# Patient Record
Sex: Male | Born: 1990 | Race: White | Hispanic: No | Marital: Married | Smoking: Never smoker
Health system: Southern US, Community
[De-identification: ages and names within clinical notes are randomized; demographics above are authoritative.]

## PROBLEM LIST (undated history)

## (undated) DIAGNOSIS — K219 Gastro-esophageal reflux disease without esophagitis: Secondary | ICD-10-CM

## (undated) HISTORY — PX: SHOULDER SURGERY: SHX246

---
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
## 2010-03-26 ENCOUNTER — Emergency Department (HOSPITAL_COMMUNITY): Admission: EM | Admit: 2010-03-26 | Discharge: 2010-03-26 | Payer: Self-pay | Admitting: Emergency Medicine

## 2010-08-15 LAB — GC/CHLAMYDIA PROBE AMP, GENITAL
Chlamydia, DNA Probe: POSITIVE — AB
GC Probe Amp, Genital: NEGATIVE

## 2011-05-02 ENCOUNTER — Emergency Department (HOSPITAL_COMMUNITY)
Admission: EM | Admit: 2011-05-02 | Discharge: 2011-05-02 | Disposition: A | Discharge status: Home / Self Care | Payer: Self-pay | Attending: Emergency Medicine | Admitting: Emergency Medicine

## 2011-05-02 ENCOUNTER — Encounter: Payer: Self-pay | Admitting: *Deleted

## 2011-05-02 DIAGNOSIS — K5900 Constipation, unspecified: Secondary | ICD-10-CM | POA: Insufficient documentation

## 2011-05-02 DIAGNOSIS — L259 Unspecified contact dermatitis, unspecified cause: Secondary | ICD-10-CM | POA: Insufficient documentation

## 2011-05-02 MED ORDER — DIPHENHYDRAMINE HCL 25 MG PO TABS: 25.0000 mg | ORAL_TABLET | Freq: Four times a day (QID) | ORAL | Status: AC

## 2011-05-02 MED ORDER — POLYETHYLENE GLYCOL 3350 17 GM/SCOOP PO POWD: 17.0000 g | Freq: Every day | ORAL | Status: AC

## 2011-05-02 MED ORDER — PREDNISONE 10 MG PO TABS: ORAL_TABLET | ORAL | Status: DC

## 2011-05-02 MED ORDER — PREDNISONE 20 MG PO TABS
60.0000 mg | ORAL_TABLET | Freq: Once | ORAL | Status: AC
  Administered 2011-05-02: 60 mg via ORAL
  Filled 2011-05-02: qty 3

## 2011-05-02 NOTE — ED Notes (Signed)
Pt reports rash to both arms and legs x 1 week

## 2011-05-03 NOTE — ED Provider Notes (Signed)
History     CSN: [id number] Arrival date & time: 05/02/2011  8:00 PM   First MD Initiated Contact with Patient 05/02/11 2001      Chief Complaint  Patient presents with  . Rash    (Consider location/radiation/quality/duration/timing/severity/associated sxs/prior treatment) HPI Comments: In addition to rash,  Patient also describes having chronic problems with constipation. Has bms qod on average,  But hard and difficult to pass.  Has not tried any medicines for relief, but states he has a healthy diet and drinks lots of water daily. See a GI doctor when a teenager in [place] for intermittent diarrhea and constipation.  Was told is sx were from "nerves" as all tests at that time were negative.  Patient is a 20 y.o. male presenting with rash. The history is provided by the patient.  Rash  This is a new problem. The current episode started more than 1 week ago. The problem has been gradually worsening. The problem is associated with an unknown (He describes wearing gloves at work,  a new job he started about 7 weeks ago.  He has been given both cotton gloves,  but also has used a latex glove during his work day.  The rash started on his hands.) factor. There has been no fever. The rash is present on the right hand, left hand, abdomen, right upper leg and left upper leg. The pain is at a severity of 7/10 (Rash burns when he baths or washes his hands.). The pain is mild. The pain has been intermittent since onset. Associated symptoms include itching and pain. He has tried OTC analgesics (Hand lotion) for the symptoms. The treatment provided no relief.    History reviewed. No pertinent past medical history.  Past Surgical History  Procedure Date  . Shoulder surgery     No family history on file.  History  Substance Use Topics  . Smoking status: Never Smoker   . Smokeless tobacco: Not on file  . Alcohol Use: No      Review of Systems  Constitutional: Negative for fever.  HENT: Negative  for congestion, sore throat and neck pain.   Eyes: Negative.   Respiratory: Negative for chest tightness and shortness of breath.   Cardiovascular: Negative for chest pain.  Gastrointestinal: Positive for constipation. Negative for nausea, abdominal pain and diarrhea.  Genitourinary: Negative.   Musculoskeletal: Negative for joint swelling and arthralgias.  Skin: Positive for itching and rash. Negative for wound.  Neurological: Negative for dizziness, weakness, light-headedness, numbness and headaches.  Hematological: Negative.   Psychiatric/Behavioral: Negative.     Allergies  Review of patient's allergies indicates no known allergies.  Home Medications   Current Outpatient Rx  Name Route Sig Dispense Refill  . DIPHENHYDRAMINE HCL 25 MG PO TABS Oral Take 1 tablet (25 mg total) by mouth every 6 (six) hours. Take as needed for itching 20 tablet 0  . POLYETHYLENE GLYCOL 3350 PO POWD Oral Take 17 g by mouth daily. 255 g 0  . PREDNISONE 10 MG PO TABS  Take 6 tabs daily by mouth for 1 day,  Then 5 tabs daily for 2 days,  4 tabs daily for 2 days,  3 tabs daily for 2 days,  2 tabs daily for 2 days,  Then 1 tab daily for 2 days.   36 tablet 0    BP 138/76  Pulse 104  Temp(Src) 98.5 F (36.9 C) (Oral)  Resp 16  Ht 6\' 2"  (1.88 m)  Wt 133 lb (  60.328 kg)  BMI 17.08 kg/m2  SpO2 100%  Physical Exam  Nursing note and vitals reviewed. Constitutional: He is oriented to person, place, and time. He appears well-developed and well-nourished.  HENT:  Head: Normocephalic and atraumatic.  Eyes: Conjunctivae are normal.  Neck: Normal range of motion.  Cardiovascular: Normal rate, regular rhythm, normal heart sounds and intact distal pulses.   Pulmonary/Chest: Effort normal and breath sounds normal. He has no wheezes.  Abdominal: Soft. Bowel sounds are normal. There is no tenderness.  Musculoskeletal: Normal range of motion.  Neurological: He is alert and oriented to person, place, and time.    Skin: Skin is warm and dry. Rash noted. No petechiae noted. Rash is papular.       Papular,  Excoriated rash most prominent on bilateral hands,  Few scattered papules on medial forearms and along lower abdomen.  Several isolated patches of raised excoriated lesions medial thighs.  No drainage, erythema or pustules, no tunneling noted.    Psychiatric: He has a normal mood and affect.    ED Course  Procedures (including critical care time)  Labs Reviewed - No data to display No results found.   1. Contact dermatitis   2. Constipation       MDM  Exam and history most consistent with contact dermatitis.  Started on prednisone taper,  Benadryl prn.  Also prescribed miralax for constipation concern.        Candis Musa, PA 05/03/11 [phone]

## 2011-05-04 NOTE — ED Provider Notes (Signed)
Medical screening examination/treatment/procedure(s) were performed by non-physician practitioner and as supervising physician I was immediately available for consultation/collaboration.  Katherine Syme S. Leea Rambeau, MD 05/04/11 1136 

## 2011-05-09 ENCOUNTER — Emergency Department (HOSPITAL_COMMUNITY)
Admission: EM | Admit: 2011-05-09 | Discharge: 2011-05-09 | Disposition: A | Discharge status: Home / Self Care | Payer: Self-pay | Attending: Emergency Medicine | Admitting: Emergency Medicine

## 2011-05-09 ENCOUNTER — Encounter (HOSPITAL_COMMUNITY): Payer: Self-pay | Admitting: *Deleted

## 2011-05-09 DIAGNOSIS — B86 Scabies: Secondary | ICD-10-CM | POA: Insufficient documentation

## 2011-05-09 MED ORDER — PERMETHRIN 5 % EX CREA: TOPICAL_CREAM | CUTANEOUS | Status: AC

## 2011-05-09 NOTE — ED Notes (Signed)
Tammy Triplett, PA at bedside. 

## 2011-05-09 NOTE — ED Notes (Signed)
Patient with no complaints at this time. Respirations even and unlabored. Skin warm/dry. Discharge instructions reviewed with patient at this time. Patient given opportunity to voice concerns/ask questions. Patient discharged at this time and left Emergency Department with steady gait.   

## 2011-05-09 NOTE — ED Notes (Signed)
Rash for 1 month, seen here for same and given prednisone

## 2011-05-10 NOTE — ED Provider Notes (Signed)
History     CSN: [id number] Arrival date & time: 05/09/2011  6:54 PM   First MD Initiated Contact with Patient 05/09/11 1855      Chief Complaint  Patient presents with  . Rash    (Consider location/radiation/quality/duration/timing/severity/associated sxs/prior treatment) Patient is a 20 y.o. male presenting with rash. The history is provided by the patient.  Rash  This is a new problem. The current episode started more than 1 week ago. The problem has not changed since onset.Associated with: contact with scabies. There has been no fever. The rash is present on the groin, left hand, right hand, right arm, left arm and torso. The patient is experiencing no pain. Associated symptoms include itching. Pertinent negatives include no blisters, no pain and no weeping. He has tried nothing for the symptoms. The treatment provided no relief.    History reviewed. No pertinent past medical history.  Past Surgical History  Procedure Date  . Shoulder surgery     History reviewed. No pertinent family history.  History  Substance Use Topics  . Smoking status: Never Smoker   . Smokeless tobacco: Not on file  . Alcohol Use: Yes      Review of Systems  Constitutional: Negative for fever.  HENT: Negative for sore throat and trouble swallowing.   Eyes: Negative for pain and itching.  Respiratory: Negative for cough.   Gastrointestinal: Negative for abdominal pain.  Musculoskeletal: Negative for myalgias, joint swelling and arthralgias.  Skin: Positive for itching and rash.  Neurological: Negative for headaches.  Hematological: Negative for adenopathy. Does not bruise/bleed easily.  All other systems reviewed and are negative.    Allergies  Review of patient's allergies indicates no known allergies.  Home Medications   Current Outpatient Rx  Name Route Sig Dispense Refill  . DIPHENHYDRAMINE HCL 25 MG PO TABS Oral Take 1 tablet (25 mg total) by mouth every 6 (six) hours. Take as  needed for itching 20 tablet 0  . PREDNISONE 10 MG PO TABS  Take 6 tabs daily by mouth for 1 day,  Then 5 tabs daily for 2 days,  4 tabs daily for 2 days,  3 tabs daily for 2 days,  2 tabs daily for 2 days,  Then 1 tab daily for 2 days.   36 tablet 0  . PERMETHRIN 5 % EX CREA  Apply from neck down, leave on for 10-12 hrs then was off.  May re-apply in 7 days if needed 60 g 0    BP 119/85  Pulse 101  Temp(Src) 98.9 F (37.2 C) (Oral)  Resp 20  Wt 132 lb (59.875 kg)  SpO2 98%  Physical Exam  Nursing note and vitals reviewed. Constitutional: He is oriented to person, place, and time. He appears well-developed and well-nourished. No distress.  HENT:  Head: Normocephalic and atraumatic.  Mouth/Throat: Oropharynx is clear and moist.  Neck: Normal range of motion. Neck supple.  Cardiovascular: Normal rate, regular rhythm and normal heart sounds.   Pulmonary/Chest: Effort normal and breath sounds normal. No respiratory distress. He exhibits no tenderness.  Abdominal: Soft.  Musculoskeletal: Normal range of motion. He exhibits no edema and no tenderness.  Lymphadenopathy:    He has no cervical adenopathy.  Neurological: He is alert and oriented to person, place, and time. No cranial nerve deficit. He exhibits normal muscle tone. Coordination normal.  Skin: Skin is warm and dry. Rash noted. Rash is papular.       Scattered erythematous papules to the dorsal  hands and web spaces.  Palms are spared.  Rash also present on the lower torso and forearms.      ED Course  Procedures (including critical care time)   1. Scabies       MDM    Papular rash to the dorsal hands, forearms and groin. Was treated recently with prednisone w/o improvement.   Papules also present within the web spaces of the fingers.  Likely scabies. Patient also had recent exposure to scabies.  I will treat with permethrin.          Christe Tellez L. Demisha Nokes, PA 05/10/11 0111

## 2011-05-14 NOTE — ED Provider Notes (Signed)
Medical screening examination/treatment/procedure(s) were performed by non-physician practitioner and as supervising physician I was immediately available for consultation/collaboration.   Nolie Bignell W Mylie Mccurley, MD 05/14/11 0041 

## 2011-11-14 ENCOUNTER — Encounter (HOSPITAL_COMMUNITY): Payer: Self-pay | Admitting: Emergency Medicine

## 2011-11-14 ENCOUNTER — Emergency Department (HOSPITAL_COMMUNITY)
Admission: EM | Admit: 2011-11-14 | Discharge: 2011-11-14 | Disposition: A | Discharge status: Home / Self Care | Payer: Self-pay | Attending: Emergency Medicine | Admitting: Emergency Medicine

## 2011-11-14 DIAGNOSIS — T672XXA Heat cramp, initial encounter: Secondary | ICD-10-CM | POA: Insufficient documentation

## 2011-11-14 DIAGNOSIS — R112 Nausea with vomiting, unspecified: Secondary | ICD-10-CM | POA: Insufficient documentation

## 2011-11-14 DIAGNOSIS — X30XXXA Exposure to excessive natural heat, initial encounter: Secondary | ICD-10-CM | POA: Insufficient documentation

## 2011-11-14 DIAGNOSIS — T675XXA Heat exhaustion, unspecified, initial encounter: Secondary | ICD-10-CM

## 2011-11-14 HISTORY — DX: Gastro-esophageal reflux disease without esophagitis: K21.9

## 2011-11-14 LAB — URINALYSIS, ROUTINE W REFLEX MICROSCOPIC
Bilirubin Urine: NEGATIVE
Glucose, UA: NEGATIVE mg/dL
Nitrite: NEGATIVE
Protein, ur: NEGATIVE mg/dL
Urobilinogen, UA: 0.2 mg/dL (ref 0.0–1.0)

## 2011-11-14 LAB — CK: Total CK: 190 U/L (ref 7–232)

## 2011-11-14 LAB — BASIC METABOLIC PANEL
CO2: 30 mEq/L (ref 19–32)
Calcium: 9.8 mg/dL (ref 8.4–10.5)
Sodium: 137 mEq/L (ref 135–145)

## 2011-11-14 MED ORDER — SODIUM CHLORIDE 0.9 % IV BOLUS (SEPSIS)
1000.0000 mL | INTRAVENOUS | Status: AC
  Administered 2011-11-14: 1000 mL via INTRAVENOUS

## 2011-11-14 MED ORDER — SODIUM CHLORIDE 0.9 % IV SOLN
Freq: Once | INTRAVENOUS | Status: AC
  Administered 2011-11-14: 05:00:00 via INTRAVENOUS

## 2011-11-14 MED ORDER — KETOROLAC TROMETHAMINE 30 MG/ML IJ SOLN
30.0000 mg | Freq: Once | INTRAMUSCULAR | Status: AC
  Administered 2011-11-14: 30 mg via INTRAVENOUS
  Filled 2011-11-14: qty 1

## 2011-11-14 MED ORDER — ONDANSETRON HCL 4 MG/2ML IJ SOLN
4.0000 mg | Freq: Once | INTRAMUSCULAR | Status: AC
  Administered 2011-11-14: 4 mg via INTRAVENOUS
  Filled 2011-11-14: qty 2

## 2011-11-14 MED ORDER — MORPHINE SULFATE 4 MG/ML IJ SOLN
2.0000 mg | Freq: Once | INTRAMUSCULAR | Status: AC
  Administered 2011-11-14: 2 mg via INTRAVENOUS
  Filled 2011-11-14: qty 1

## 2011-11-14 MED ORDER — PROMETHAZINE HCL 25 MG PO TABS: 12.5000 mg | ORAL_TABLET | Freq: Four times a day (QID) | ORAL | Status: DC | PRN

## 2011-11-14 NOTE — ED Notes (Signed)
Remains resting comfortably. Denies needs. No distress. Call bell within reach. Friend at bedside. Will continue to monitor.

## 2011-11-14 NOTE — ED Notes (Signed)
Lab at bedside to draw blood.

## 2011-11-14 NOTE — ED Provider Notes (Signed)
History     CSN: [id number]  Arrival date & time 11/14/11  0105   First MD Initiated Contact with Patient 11/14/11 0123      Chief Complaint  Patient presents with  . Weakness  . Abdominal Pain  . Emesis    (Consider location/radiation/quality/duration/timing/severity/associated sxs/prior treatment) HPI Christopher Frey is a 21 y.o. male who presents to the Emergency Department complaining of muscle cramping, nausea and vomiting after working outdoors all day yesterday, He states he began feeling poorly midday yesterday despite drinking lots of fluids. Now with abdominal cramping, back cramping and leg cramping. Has taken no medicines.   PCP De. McGough    Past Medical History  Diagnosis Date  . Acid reflux     Past Surgical History  Procedure Date  . Shoulder surgery     History reviewed. No pertinent family history.  History  Substance Use Topics  . Smoking status: Never Smoker   . Smokeless tobacco: Not on file  . Alcohol Use: No      Review of Systems  Constitutional: Negative for fever.       10 Systems reviewed and are negative for acute change except as noted in the HPI.  HENT: Negative for congestion.   Eyes: Negative for discharge and redness.  Respiratory: Negative for cough and shortness of breath.   Cardiovascular: Negative for chest pain.  Gastrointestinal: Positive for nausea, vomiting and abdominal pain.  Musculoskeletal: Negative for back pain.       Leg and back cramping  Skin: Negative for rash.  Neurological: Negative for syncope, numbness and headaches.  Psychiatric/Behavioral:       No behavior change.    Allergies  Review of patient's allergies indicates no known allergies.  Home Medications   Current Outpatient Rx  Name Route Sig Dispense Refill  . PREDNISONE 10 MG PO TABS  Take 6 tabs daily by mouth for 1 day,  Then 5 tabs daily for 2 days,  4 tabs daily for 2 days,  3 tabs daily for 2 days,  2 tabs daily for 2 days,  Then 1 tab  daily for 2 days.   36 tablet 0    BP 125/73  Pulse 101  Temp 97.9 F (36.6 C) (Oral)  Resp 18  Ht 6\' 2"  (1.88 m)  Wt 138 lb (62.596 kg)  BMI 17.72 kg/m2  SpO2 98%  Physical Exam  Nursing note and vitals reviewed. Constitutional: He appears well-developed and well-nourished. No distress.       Awake, alert, nontoxic appearance.  HENT:  Head: Atraumatic.  Eyes: EOM are normal. Pupils are equal, round, and reactive to light. Right eye exhibits no discharge. Left eye exhibits no discharge.  Neck: Neck supple.  Cardiovascular: Regular rhythm and normal heart sounds.   Pulmonary/Chest: Effort normal and breath sounds normal. He exhibits no tenderness.  Abdominal: Soft. There is no tenderness. There is no rebound and no guarding.  Musculoskeletal: He exhibits no tenderness.       Baseline ROM, no obvious new focal weakness.  Neurological:       Mental status and motor strength appears baseline for patient and situation.  Skin: No rash noted.  Psychiatric: He has a normal mood and affect.    ED Course  Procedures (including critical care time) Results for orders placed during the hospital encounter of 11/14/11  BASIC METABOLIC PANEL      Component Value Range   Sodium 137  135 - 145 mEq/L  Potassium 4.7  3.5 - 5.1 mEq/L   Chloride 97  96 - 112 mEq/L   CO2 30  19 - 32 mEq/L   Glucose, Bld 115 (*) 70 - 99 mg/dL   BUN 21  6 - 23 mg/dL   Creatinine, Ser 1.61  0.50 - 1.35 mg/dL   Calcium 9.8  8.4 - 09.6 mg/dL   GFR calc non Af Amer >90  >90 mL/min   GFR calc Af Amer >90  >90 mL/min  CK      Component Value Range   Total CK 190  7 - 232 U/L       MDM  Patient with heat related illness characterized by muscle cramping, nausea and vomiting. Given 5 L IVF,  antiinflammatory, analgesic and antiemetic. Patient with relief from cramping and nausea. Able to take PO fluids. Ambulated in the department.  Pt feels improved after observation and/or treatment in ED.Pt stable in ED  with no significant deterioration in condition.The patient appears reasonably screened and/or stabilized for discharge and I doubt any other medical condition or other [facility] requiring further screening, evaluation, or treatment in the ED at this time prior to discharge.  MDM Reviewed: nursing note and vitals Interpretation: labs           Nicoletta Dress. Colon Branch, MD 11/14/11 [phone]

## 2011-11-14 NOTE — ED Notes (Signed)
Patient states he works outside and started feeling weak and nauseated since lunchtime yesterday. Weakness is worse and patient vomited before arrival to ED. Also complaining of abdominal pain and back cramping.

## 2011-11-14 NOTE — ED Notes (Signed)
Remains resting on back. Pain 4\10 at this time. States he can not pass urine for specimen. Friend at bedside. Call bell within reach. Denies needs.

## 2011-11-14 NOTE — Discharge Instructions (Signed)
Drink lots of fluids. Use the nausea medicine as needed.   Heat-Related Illness Heat-related illnesses occur when the body is unable to properly cool itself. The body normally cools itself by sweating. However, under some conditions sweating is not enough. In these cases, a person's body temperature rises rapidly. Very high body temperatures may damage the brain or other vital organs. Some examples of heat-related illnesses include:  Heat stroke. This occurs when the body is unable to regulate its temperature. The body's temperature rises rapidly, the sweating mechanism fails, and the body is unable to cool down. Body temperature may rise to 106 F (41 C) or higher within 10 to 15 minutes. Heat stroke can cause death or permanent disability if emergency treatment is not provided.   Heat exhaustion. This is a milder form of heat-related illness that can develop after several days of exposure to high temperatures and not enough fluids. It is the body's response to an excessive loss of the water and salt contained in sweat.   Heat cramps. These usually affect people who sweat a lot during heavy activity. This sweating drains the body's salt and moisture. The low salt level in the muscles causes painful cramps. Heat cramps may also be a symptom of heat exhaustion. Heat cramps usually occur in the abdomen, arms, or legs. Get medical attention for cramps if you have heart problems or are on a low-sodium diet.  Those that are at greatest risk for heat-related illnesses include:   The elderly.   Infant and the very young.   People with mental illness and chronic diseases.   People who are overweight (obese).   Young and healthy people can even succumb to heat if they participate in strenuous physical activities during hot weather.  CAUSES  Several factors affect the body's ability to cool itself during extremely hot weather. When the humidity is high, sweat will not evaporate as quickly. This  prevents the body from releasing heat quickly. Other factors that can affect the body's ability to cool down include:   Age.   Obesity.   Fever.   Dehydration.   Heart disease.   Mental illness.   Poor circulation.   Sunburn.   Prescription drug use.   Alcohol use.  SYMPTOMS  Heat stroke: Warning signs of heat stroke vary, but may include:  An extremely high body temperature (above 103F orally).   A fast, strong pulse.   Dizziness.   Confusion.   Red, hot, and dry skin.   No sweating.   Throbbing headache.   Feeling sick to your stomach (nauseous).   Unconsciousness.  Heat exhaustion: Warning signs of heat exhaustion include:  Heavy sweating.   Tiredness.   Headache.   Paleness.   Weakness.   Feeling sick to your stomach (nauseous) or vomiting.   Muscle cramps.  Heat cramps  Muscle pains or spasms.  TREATMENT  Heat stroke  Get into a cool environment. An indoor place that is air-conditioned may be best.   Take a cool shower or bath. Have someone around to make sure you are okay.   Take your temperature. Make sure it is going down.  Heat exhaustion  Drink plenty of fluids. Do not drink liquids that contain caffeine, alcohol, or large amounts of sugar. These cause you to lose more body fluid. Also, avoid very cold drinks. They can cause stomach cramps.   Get into a cool environment. An indoor place that is air-conditioned may be best.   Take a cool  shower or bath. Have someone around to make sure you are okay.   Put on lightweight clothing.  Heat cramps  Stop whatever activity you were doing. Do not attempt to do that activity for at least 3 hours after the cramps have gone away.   Get into a cool environment. An indoor place that is air-conditioned may be best.  HOME CARE INSTRUCTIONS  To protect your health when temperatures are extremely high, follow these tips:  During heavy exercise in a hot environment, drink two to four glasses  (16-32 ounces) of cool fluids each hour. Do not wait until you are thirsty to drink. Warning: If your caregiver limits the amount of fluid you drink or has you on water pills, ask how much you should drink while the weather is hot.   Do not drink liquids that contain caffeine, alcohol, or large amounts of sugar. These cause you to lose more body fluid.   Avoid very cold drinks. They can cause stomach cramps.   Wear appropriate clothing. Choose lightweight, light-colored, loose-fitting clothing.   If you must be outdoors, try to limit your outdoor activity to morning and evening hours. Try to rest often in shady areas.   If you are not used to working or exercising in a hot environment, start slowly and pick up the pace gradually.   Stay cool in an air-conditioned place if possible. If your home does not have air conditioning, go to the shopping mall or [employer].   Taking a cool shower or bath may help you cool off.  SEEK MEDICAL CARE IF:   You see any of the symptoms listed above. You may be dealing with a life-threatening emergency.   Symptoms worsen or last longer than 1 hour.   Heat cramps do not get better in 1 hour.  MAKE SURE YOU:   Understand these instructions.   Will watch your condition.   Will get help right away if you are not doing well or get worse.  Document Released: 02/27/2008 Document Revised: 05/09/2011 Document Reviewed: 02/27/2008 [facility] Patient Information 2012 [place], [place].

## 2011-11-14 NOTE — ED Notes (Signed)
Remains resting in bed on back. Pain 4\10. Denies nausea. Resting comfortably. Denies needs. Will continue to monitor. Friend at bedside. Bed in low position and locked with side rails up. Call bell within reach. 

## 2011-11-14 NOTE — ED Notes (Signed)
Pain 2\10 at this time. Denies nausea. Resting comfortably in bed on back. Call bell at bedside. Will continue to monitor.

## 2011-11-14 NOTE — ED Notes (Signed)
Remains resting with eyes closed and lights off. Normal saline infusing well. No signs of infiltration. Call bell within reach. Will continue to monitor.

## 2011-11-14 NOTE — ED Notes (Signed)
Remains resting in bed on back. Pain 4\10. Denies nausea. Resting comfortably. Denies needs. Will continue to monitor. Friend at bedside. Bed in low position and locked with side rails up. Call bell within reach.

## 2011-11-14 NOTE — ED Notes (Signed)
Into room to assess patient. States he started having all over body pain, nausea, and vomiting as well as lower back pain after working outside all day. States it is a constant cramp, ache. Nothing makes it better or worse. Pain 8\10. Active bowel sounds in all fields. No tenderness. Good skin turgor. Dry mucous membranes.

## 2011-11-20 ENCOUNTER — Encounter (HOSPITAL_COMMUNITY): Payer: Self-pay | Admitting: *Deleted

## 2011-11-20 ENCOUNTER — Emergency Department (HOSPITAL_COMMUNITY)
Admission: EM | Admit: 2011-11-20 | Discharge: 2011-11-20 | Disposition: A | Discharge status: Home / Self Care | Payer: Self-pay | Attending: Emergency Medicine | Admitting: Emergency Medicine

## 2011-11-20 DIAGNOSIS — K219 Gastro-esophageal reflux disease without esophagitis: Secondary | ICD-10-CM | POA: Insufficient documentation

## 2011-11-20 DIAGNOSIS — T675XXA Heat exhaustion, unspecified, initial encounter: Secondary | ICD-10-CM | POA: Insufficient documentation

## 2011-11-20 DIAGNOSIS — R42 Dizziness and giddiness: Secondary | ICD-10-CM | POA: Insufficient documentation

## 2011-11-20 DIAGNOSIS — X30XXXA Exposure to excessive natural heat, initial encounter: Secondary | ICD-10-CM | POA: Insufficient documentation

## 2011-11-20 LAB — URINALYSIS, ROUTINE W REFLEX MICROSCOPIC
Bilirubin Urine: NEGATIVE
Glucose, UA: NEGATIVE mg/dL
Hgb urine dipstick: NEGATIVE
Ketones, ur: NEGATIVE mg/dL
Leukocytes, UA: NEGATIVE
Nitrite: NEGATIVE
Protein, ur: NEGATIVE mg/dL
Specific Gravity, Urine: 1.01 (ref 1.005–1.030)
Urobilinogen, UA: 0.2 mg/dL (ref 0.0–1.0)
pH: 7.5 (ref 5.0–8.0)

## 2011-11-20 LAB — BASIC METABOLIC PANEL
CO2: 31 mEq/L (ref 19–32)
Chloride: 99 mEq/L (ref 96–112)
GFR calc non Af Amer: >90 mL/min (ref >90)

## 2011-11-20 LAB — POCT I-STAT TROPONIN I: Troponin i, poc: 0 ng/mL (ref 0.00–0.08)

## 2011-11-20 LAB — CK: Total CK: 124 U/L (ref 7–232)

## 2011-11-20 MED ORDER — SODIUM CHLORIDE 0.9 % IV BOLUS (SEPSIS)
2000.0000 mL | Freq: Once | INTRAVENOUS | Status: AC
  Administered 2011-11-20: 2000 mL via INTRAVENOUS

## 2011-11-20 MED ORDER — DIPHENHYDRAMINE HCL 50 MG/ML IJ SOLN
25.0000 mg | Freq: Once | INTRAMUSCULAR | Status: AC
  Administered 2011-11-20: 25 mg via INTRAVENOUS
  Filled 2011-11-20: qty 1

## 2011-11-20 MED ORDER — SODIUM CHLORIDE 0.9 % IV SOLN
INTRAVENOUS | Status: DC
  Administered 2011-11-20: 17:00:00 via INTRAVENOUS

## 2011-11-20 MED ORDER — METOCLOPRAMIDE HCL 5 MG/ML IJ SOLN
10.0000 mg | Freq: Once | INTRAMUSCULAR | Status: AC
  Administered 2011-11-20: 10 mg via INTRAVENOUS
  Filled 2011-11-20: qty 2

## 2011-11-20 NOTE — Discharge Instructions (Signed)
Drink plenty of fluids, especially sports drinks when you are working outside in the heat, just water will not keep you hydrated.  Try to stay cool today and tomorrow. You will find you will be extra sensitive to heat for awhile.   Heat Illness Heat exhaustion happens when the body loses too much water through sweating. This can lead to heat stroke. Heat stroke is a medical emergency. People who work in [employer], athletes, and older people are at greater risk for suffering from heat illness. SYMPTOMS   Exhaustion.   Dizziness.   Fainting.   Muscle cramps.   Nausea.   Vomiting.   Chills and goose bumps.  PREVENTION   You must drink increased amounts of sports drinks during hot weather to prevent heat illness.   This is especially true if you work or do vigorous exercise in the heat. Up to a gallon of sweat can be lost every hour extremely hot or humid conditions.   You will stay cooler by reducing your effort and by dousing yourself with water often.   Certain drugs increase the risk of heat illness because they reduce sweating. These include antidepressants and antihistamines.   Be more cautious during hot weather.   Drink several glasses of water before, during, and after vigorous activity.  SEEK MEDICAL CARE IF:  You have any heat-related problems. Document Released: 06/27/2004 Document Revised: 05/09/2011 Document Reviewed: 02/18/2008 [facility] Patient Information 2012 [place], [place].

## 2011-11-20 NOTE — ED Provider Notes (Signed)
History   This chart was scribed for Ward Givens, MD by Charolett Bumpers . The patient was seen in room APA01/APA01.    CSN: [id number]  Arrival date & time 11/20/11  1458   First MD Initiated Contact with Patient 11/20/11 1509      Chief Complaint  Patient presents with  . Loss of Consciousness    (Consider location/radiation/quality/duration/timing/severity/associated sxs/prior treatment) HPI Christopher Frey is a 21 y.o. male who presents to the Emergency Department complaining of single-episode, moderate, acute onset LOC that occurred PTA today. 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 Patient states that he had been working all day today outside starting at 6:00 am, and breaking for lunch at 11:00 am. Patient states that he had diaphoresis at lunch and ate outside in [place] area. Patient states that he starting feeling dizzy after lunch when he went back to work and started vomiting despite drinking lots of water. Patient states that he then had a syncope episode. Per family, the patient's "eyes rolled back in his head". Pt was sitting in a truck when he started vomiting and then was laid down after he passed out.  Patient denies falling or any injuries. Patient reports an associated headache and generalized weakness. Patient states that he is otherwise healthy and reports no prior medical problems. Patient denies taking any medications. Patient denies smoking or alcohol use. 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   No PCP  Past Medical History  Diagnosis Date  . Acid reflux     Past Surgical History  Procedure Date  . Shoulder surgery     History reviewed. No pertinent family history.  History  Substance Use Topics  . Smoking status: Never Smoker   . Smokeless tobacco: Not on file  . Alcohol Use: No    Employed by father   Review of Systems  Constitutional: Positive for diaphoresis.  Gastrointestinal: Positive for nausea and vomiting.  Neurological: Positive for syncope, weakness and headaches.  All other systems reviewed and are negative.    Allergies  Review of patient's allergies indicates no known allergies.  Home Medications   No current outpatient prescriptions on file.  BP 132/88  Pulse 81  Temp 98 F (36.7 C) (Oral)  Resp 20  Ht 6\' 2"  (1.88 m)  Wt 135 lb (61.236 kg)  BMI 17.33 kg/m2  SpO2 100%  Vital signs normal    Physical Exam  Nursing note and vitals reviewed. Constitutional: He is oriented to person, place, and time. He appears well-developed and well-nourished.  Non-toxic appearance. He does not appear ill. No distress.       Patient appeared weak, kept eyes closed and let his mother talk for him.    HENT:  Head: Normocephalic and atraumatic.  Right Ear: External ear normal.  Left Ear: External ear normal.  Nose: Nose normal. No mucosal edema or rhinorrhea.  Mouth/Throat: Mucous membranes are dry. No dental abscesses or uvula swelling.       Dry tongue  Eyes: Conjunctivae and EOM are normal. Pupils are equal, round, and reactive to light.  Neck: Normal range of motion and full passive range of motion without pain. Neck supple. No tracheal deviation present.  Cardiovascular: Normal rate, regular rhythm, normal heart sounds and intact distal pulses.  Exam  reveals no gallop and no friction rub.   No murmur heard. Pulmonary/Chest: Effort normal and breath sounds normal. No respiratory distress. He has no wheezes. He has no rhonchi. He has no rales. He exhibits no tenderness and no crepitus.  Abdominal: Soft. Normal appearance and bowel sounds are normal. He exhibits no distension. There is no tenderness. There is no rebound and no guarding.  Musculoskeletal: Normal range of motion. He exhibits no edema and no tenderness.       Moves all extremities  well.   Neurological: He is alert and oriented to person, place, and time. He has normal strength. No cranial nerve deficit or sensory deficit.  Skin: Skin is warm, dry and intact. No rash noted. No erythema. There is pallor.  Psychiatric: His speech is normal and behavior is normal. His mood appears not anxious.       Flat, lying with eyes closed, speaks softly    ED Course  Procedures (including critical care time)   Medications  0.9 %  sodium chloride infusion (  Intravenous New Bag/Given 11/20/11 1633)  sodium chloride 0.9 % bolus 2,000 mL (2000 mL Intravenous Given 11/20/11 1548)  metoCLOPramide (REGLAN) injection 10 mg (10 mg Intravenous Given 11/20/11 1548)  diphenhydrAMINE (BENADRYL) injection 25 mg (25 mg Intravenous Given 11/20/11 1548)      DIAGNOSTIC STUDIES: Oxygen Saturation is 100% on room air, normal by my interpretation.    COORDINATION OF CARE:  1537: Discussed planned course of treatment with the patient and family, who is agreeable at this time.  1545: Medication Orders: Diphenhydramine (Benadryl) injection 25 mg-once; Metoclopramide (Reglan) injection 10 mg-once; 0.9% sodium chloride infusion-continuous; Sodium chloride 0.9% bolus 2,000 mL-once.  1723: Recheck: Informed patient of lab results. Patient states that he is feeling improved and appears improved. Has been drinking fluids without problems.   Pt ambulated and feels fine.    Results for orders placed during the hospital encounter of 11/20/11  BASIC METABOLIC PANEL      Component Value Range   Sodium 138  135 - 145 mEq/L   Potassium 4.9  3.5 - 5.1 mEq/L   Chloride 99  96 - 112 mEq/L   CO2 31  19 - 32 mEq/L   Glucose, Bld 107 (*) 70 - 99 mg/dL   BUN 13  6 - 23 mg/dL   Creatinine, Ser 1.61  0.50 - 1.35 mg/dL   Calcium 09.6  8.4 - 04.5 mg/dL   GFR calc non Af Amer >90  >90 mL/min   GFR calc Af Amer >90  >90 mL/min  CK      Component Value Range   Total CK 124  7 - 232 U/L  URINALYSIS, ROUTINE W  REFLEX MICROSCOPIC      Component Value Range   Color, Urine STRAW (*) YELLOW   APPearance CLEAR  CLEAR   Specific Gravity, Urine 1.010  1.005 - 1.030   pH 7.5  5.0 - 8.0   Glucose, UA NEGATIVE  NEGATIVE mg/dL   Hgb urine dipstick NEGATIVE  NEGATIVE   Bilirubin Urine NEGATIVE  NEGATIVE   Ketones, ur NEGATIVE  NEGATIVE mg/dL   Protein, ur NEGATIVE  NEGATIVE mg/dL   Urobilinogen, UA 0.2  0.0 - 1.0 mg/dL   Nitrite NEGATIVE  NEGATIVE   Leukocytes, UA NEGATIVE  NEGATIVE  POCT I-STAT TROPONIN I      Component Value Range   Troponin i, poc 0.00  0.00 - 0.08 ng/mL   Comment 3  Laboratory interpretation all normal    Date: 11/20/2011  Rate: 78  Rhythm: normal sinus rhythm  QRS Axis: right  Intervals: normal  ST/T Wave abnormalities: early repolarization  Conduction Disutrbances:none  Narrative Interpretation: LAE  Old EKG Reviewed: none available   1. Heat exhaustion    Plan discharge  Devoria Albe, MD, FACEP   MDM   I personally performed the services described in this documentation, which was scribed in my presence. The recorded information has been reviewed and considered. Devoria Albe, MD, Armando Gang        Ward Givens, MD 11/20/11 1754

## 2011-11-20 NOTE — ED Notes (Signed)
Vomited, then went to sit in a truck. When seen by his father "eyes rolled back in his head"  Had been working outside .  Pt shakey at triage.  Seen here last week and had to have IV fld.  Removed a tick last week.  Alert.

## 2012-05-20 ENCOUNTER — Emergency Department (HOSPITAL_COMMUNITY)
Admission: EM | Admit: 2012-05-20 | Discharge: 2012-05-20 | Disposition: A | Discharge status: Home / Self Care | Payer: Worker's Compensation | Attending: Emergency Medicine | Admitting: Emergency Medicine

## 2012-05-20 ENCOUNTER — Other Ambulatory Visit (HOSPITAL_COMMUNITY): Payer: Self-pay | Admitting: Preventative Medicine

## 2012-05-20 ENCOUNTER — Encounter (HOSPITAL_COMMUNITY): Payer: Self-pay | Admitting: Emergency Medicine

## 2012-05-20 ENCOUNTER — Emergency Department (HOSPITAL_COMMUNITY): Payer: Worker's Compensation

## 2012-05-20 DIAGNOSIS — M542 Cervicalgia: Secondary | ICD-10-CM

## 2012-05-20 DIAGNOSIS — S139XXA Sprain of joints and ligaments of unspecified parts of neck, initial encounter: Secondary | ICD-10-CM | POA: Insufficient documentation

## 2012-05-20 DIAGNOSIS — Y92410 Unspecified street and highway as the place of occurrence of the external cause: Secondary | ICD-10-CM | POA: Insufficient documentation

## 2012-05-20 DIAGNOSIS — Z8719 Personal history of other diseases of the digestive system: Secondary | ICD-10-CM | POA: Insufficient documentation

## 2012-05-20 DIAGNOSIS — Y9389 Activity, other specified: Secondary | ICD-10-CM | POA: Insufficient documentation

## 2012-05-20 DIAGNOSIS — S161XXA Strain of muscle, fascia and tendon at neck level, initial encounter: Secondary | ICD-10-CM

## 2012-05-20 MED ORDER — HYDROCODONE-ACETAMINOPHEN 5-325 MG PO TABS: ORAL_TABLET | ORAL | Status: DC

## 2012-05-20 MED ORDER — IBUPROFEN 800 MG PO TABS
800.0000 mg | ORAL_TABLET | Freq: Once | ORAL | Status: AC
  Administered 2012-05-20: 800 mg via ORAL
  Filled 2012-05-20: qty 1

## 2012-05-20 MED ORDER — CYCLOBENZAPRINE HCL 10 MG PO TABS: 10.0000 mg | ORAL_TABLET | Freq: Three times a day (TID) | ORAL | Status: DC | PRN

## 2012-05-20 MED ORDER — IBUPROFEN 800 MG PO TABS: 800.0000 mg | ORAL_TABLET | Freq: Three times a day (TID) | ORAL | Status: DC
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
## 2012-05-20 NOTE — ED Provider Notes (Signed)
Medical screening examination/treatment/procedure(s) were performed by non-physician practitioner and as supervising physician I was immediately available for consultation/collaboration.  Donnetta Hutching, MD 05/20/12 [phone]

## 2012-05-20 NOTE — ED Notes (Signed)
Pt c/o neck pain and rt shoulder pain since yesterday. Pt describes pain as needle pricks.
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
## 2012-05-21 ENCOUNTER — Ambulatory Visit (HOSPITAL_COMMUNITY)
Admission: RE | Admit: 2012-05-21 | Discharge: 2012-05-21 | Disposition: A | Discharge status: Home / Self Care | Payer: Worker's Compensation | Source: Ambulatory Visit | Attending: Preventative Medicine | Admitting: Preventative Medicine

## 2012-05-21 DIAGNOSIS — M5020 Other cervical disc displacement, unspecified cervical region: Secondary | ICD-10-CM | POA: Insufficient documentation

## 2012-05-21 DIAGNOSIS — M542 Cervicalgia: Secondary | ICD-10-CM

## 2012-06-05 ENCOUNTER — Ambulatory Visit (HOSPITAL_COMMUNITY)
Admission: RE | Admit: 2012-06-05 | Discharge: 2012-06-05 | Disposition: A | Discharge status: Home / Self Care | Payer: Worker's Compensation | Source: Ambulatory Visit | Attending: Preventative Medicine | Admitting: Preventative Medicine

## 2012-06-05 DIAGNOSIS — M2560 Stiffness of unspecified joint, not elsewhere classified: Secondary | ICD-10-CM | POA: Insufficient documentation

## 2012-06-05 DIAGNOSIS — M542 Cervicalgia: Secondary | ICD-10-CM | POA: Insufficient documentation

## 2012-06-05 DIAGNOSIS — M6281 Muscle weakness (generalized): Secondary | ICD-10-CM | POA: Insufficient documentation

## 2012-06-05 DIAGNOSIS — IMO0001 Reserved for inherently not codable concepts without codable children: Secondary | ICD-10-CM | POA: Insufficient documentation

## 2012-06-05 DIAGNOSIS — R262 Difficulty in walking, not elsewhere classified: Secondary | ICD-10-CM | POA: Insufficient documentation
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
## 2012-06-08 ENCOUNTER — Ambulatory Visit (HOSPITAL_COMMUNITY)
Admission: RE | Admit: 2012-06-08 | Discharge: 2012-06-08 | Disposition: A | Discharge status: Home / Self Care | Payer: Worker's Compensation | Source: Ambulatory Visit | Attending: Preventative Medicine | Admitting: Preventative Medicine

## 2012-06-08 NOTE — Progress Notes (Signed)
Physical Therapy Treatment Patient Details  Name: Christopher Frey MRN: [id number] Date of Birth: 15-Dec-1990  Today's Date: 06/08/2012 Time: 1304-1400 PT Time Calculation (min): 56 min  Visit#: 2  of 6   Re-eval: 06/19/12 Charges: Therex x 30' Traction w/MHP x 15'  Authorization: med assurance    Subjective: Symptoms/Limitations Symptoms: Pt states that traction decrease pain last session. Pain Assessment Currently in Pain?: Yes Pain Score:   6 Pain Location: Neck Pain Orientation: Mid;Posterior   Exercise/Treatments Seated Exercises Cervical Rotation: 10 reps;Both Lateral Flexion: 10 reps;Both W Back: 10 reps Shoulder Shrugs: 10 reps Shoulder Shrugs Limitations: up back and relax Supine Ab Set: 10 reps;5 seconds Bent Knee Raise: 10 reps Other Supine Lumbar Exercises: B UE flexion with ab set x 10  Modalities Modalities: Moist Heat;Traction Moist Heat Therapy Number Minutes Moist Heat: 15 Minutes Moist Heat Location:  (cervical and lumbar during traction) Traction Type of Traction: Cervical Max (lbs): 12# Hold Time: Static Time: 28'  Physical Therapy Assessment and Plan PT Assessment and Plan Clinical Impression Statement: Pt completes therex with good form after initial cueing and demonstration. Began mechanical cervical traction to decrease pain. Pt reports cervical pain decrease to 3/10 at end of session. PT Duration:  (2 weeks) PT Plan: Continue to progress per PT POC. Begin tband exercises and thumbtacks next session.      Problem List Patient Active Problem List  Diagnosis  . Difficulty in walking  . Stiffness of joints, not elsewhere classified, multiple sites    PT - End of Session Activity Tolerance: Patient tolerated treatment well General Behavior During Session: [facility] for tasks performed  Seth Bake, PTA 06/08/2012, 2:24 PM

## 2012-06-10 ENCOUNTER — Ambulatory Visit (HOSPITAL_COMMUNITY)
Admission: RE | Admit: 2012-06-10 | Discharge: 2012-06-10 | Disposition: A | Discharge status: Home / Self Care | Payer: Worker's Compensation | Source: Ambulatory Visit | Attending: Preventative Medicine | Admitting: Preventative Medicine

## 2012-06-10 DIAGNOSIS — M2560 Stiffness of unspecified joint, not elsewhere classified: Secondary | ICD-10-CM

## 2012-06-10 DIAGNOSIS — R262 Difficulty in walking, not elsewhere classified: Secondary | ICD-10-CM

## 2012-06-10 NOTE — Progress Notes (Signed)
Physical Therapy Treatment Patient Details  Name: JORAH HUA MRN: [id number] Date of Birth: June 09, 1990  Today's Date: 06/10/2012 Time: 1303-1358 PT Time Calculation (min): 55 min  Visit#: 3  of 6   Re-eval: 06/19/12    Authorization: med assurance  Authorization Time Period:    Authorization Visit#:   of     Subjective: Symptoms/Limitations Symptoms: Pt states that the mechanical traction caused him to have increased pain last time requests not to do it again Pain Assessment Pain Score:   3 Pain Location: Neck Pain Orientation: Posterior Pain Type: Acute pain   Exercise/Treatments Stretches Single Knee to Chest Stretch: 5 reps Lower Trunk Rotation: 5 reps Aerobic Elliptical: 5' L 3 UBE (Upper Arm Bike): backward 3'  Standing Scapular Retraction: Strengthening;10 reps;Theraband Theraband Level (Scapular Retraction): Level 4 (Blue) Row: Strengthening;10 reps;Theraband Theraband Level (Row): Level 4 (Blue) Shoulder Extension: Strengthening;10 reps;Theraband Other Standing Lumbar Exercises: thumb tacks x 1 min Seated Other Seated Lumbar Exercises: w back x 10 Supine Clam: 10 reps Bent Knee Raise: 10 reps Bridge: 10 reps Isometric Hip Flexion: 10 reps    Modalities Modalities: Moist Heat Manual Therapy Manual Therapy: Other (comment) Other Manual Therapy: gentle manual traction with STM x 10 Moist Heat Therapy Number Minutes Moist Heat: 39 Minutes Moist Heat Location:  (cervical and lumbar while doing lumbar ex)  Physical Therapy Assessment and Plan PT Assessment and Plan Clinical Impression Statement: Pt has good form w/exercises.  No mechanical traction as this irritates pt.  PT Plan: begin prone ex as well as assessing cervical ROM to ensure ROM is approaching Full range at this time    Goals Home Exercise Program PT Goal: Perform Home Exercise Program - Progress: Met PT Short Term Goals PT Short Term Goal 1 - Progress: Met PT Short Term Goal 2 -  Progress: Progressing toward goal PT Short Term Goal 3 - Progress: Progressing toward goal PT Short Term Goal 4 - Progress: Progressing toward goal  Problem List Patient Active Problem List  Diagnosis  . Difficulty in walking  . Stiffness of joints, not elsewhere classified, multiple sites    PT - End of Session Activity Tolerance: Patient tolerated treatment well General Behavior During Session: [facility] for tasks performed PT Plan of Care PT Home Exercise Plan: to give t-band and higher stab ex next treatment.  GP    Quentez Lober,CINDY 06/10/2012, 2:18 PM

## 2012-06-12 ENCOUNTER — Ambulatory Visit (HOSPITAL_COMMUNITY)
Admission: RE | Admit: 2012-06-12 | Discharge: 2012-06-12 | Disposition: A | Discharge status: Home / Self Care | Payer: Worker's Compensation | Source: Ambulatory Visit | Attending: Preventative Medicine | Admitting: Preventative Medicine

## 2012-06-12 DIAGNOSIS — M2560 Stiffness of unspecified joint, not elsewhere classified: Secondary | ICD-10-CM

## 2012-06-12 DIAGNOSIS — R262 Difficulty in walking, not elsewhere classified: Secondary | ICD-10-CM

## 2012-06-12 NOTE — Progress Notes (Signed)
Physical Therapy Treatment Patient Details  Name: Christopher Frey MRN: [id number] Date of Birth: [birth year]  Today's Date: 06/12/2012 Time: 0850-0945 PT Time Calculation (min): 55 min Charge:  HMP, [place] x 8'; Manual x 10' There ex x 16' Visit#: 4  of 6   Re-eval: 06/17/12    Authorization: med assurance  Subjective: Symptoms/Limitations Symptoms: Pt states manual traction improved sx; sates low back is bothering him more than his neck at this time. Pain Assessment Currently in Pain?: Yes Pain Score:   3 Pain Location: Neck Pain Orientation: Right;Left Pain Type: Acute pain   Exercise/Treatments    Stretches Single Knee to Chest Stretch: 5 reps Aerobic UBE (Upper Arm Bike): backward x 5' Standing Scapular Retraction: Strengthening;5 reps;Theraband Theraband Level (Scapular Retraction):  (T band given for HEP will not do t-band exercise in dept) Row: Strengthening;10 reps Shoulder Extension: Strengthening;5 reps Other Standing Lumbar Exercises: wall push-ups x 10   Quadruped Other Quadruped Lumbar Exercises: mad cat/old horse x 5  Modalities Modalities: Ultrasound;Moist Heat Manual Therapy Manual Therapy: Other (comment) Joint Mobilization: grade II mobs to lower thoracic/upper lumbar Other Manual Therapy: gentle manual cervical traction with PROM to increase strength. Moist Heat Therapy Number Minutes Moist Heat: 20 Minutes Moist Heat Location:  (cervical and lumbar in supine) Ultrasound Ultrasound Location: B lower thoracic/ upper lumbar Ultrasound Parameters: 1.2 w/cm2 x Ultrasound Goals: Pain  Physical Therapy Assessment and Plan PT Assessment and Plan Clinical Impression Statement: PT given T-band ex for HEP; Pt added old horse/mad cat to improve ROM. Treatment today focused on lumbar as this was where pt states he is having most of his pain. PT Plan: begin prone ex next treatment; Have pt complete FROM for cervical area.    Problem List Patient Active  Problem List  Diagnosis  . Difficulty in walking  . Stiffness of joints, not elsewhere classified, multiple sites    PT - End of Session Activity Tolerance: Patient tolerated treatment well General Behavior During Session: [facility] for tasks performed Cognition: [facility] for tasks performed  GP    Charmane Protzman,CINDY 06/12/2012, 1:49 PM

## 2012-06-15 ENCOUNTER — Ambulatory Visit (HOSPITAL_COMMUNITY)
Admission: RE | Admit: 2012-06-15 | Discharge: 2012-06-15 | Disposition: A | Discharge status: Home / Self Care | Payer: Worker's Compensation | Source: Ambulatory Visit | Attending: Preventative Medicine | Admitting: Preventative Medicine

## 2012-06-15 NOTE — Progress Notes (Signed)
Physical Therapy Treatment Patient Details  Name: Christopher Frey MRN: [id number] Date of Birth: 14-Oct-1990  Today's Date: 06/15/2012 Time: 6962-9528 PT Time Calculation (min): 54 min  Visit#: 5  of 6   Re-eval: 06/17/12 Authorization: med assurance  Charges:  therex 25', [place] 8', MHP X 1 unit   Subjective: Symptoms/Limitations Symptoms: Pt. states his neck continues to do well.  Most of his issue remains in his back with 6/10 pain.  States he was compliant with therex given at last visit. Pain Assessment Currently in Pain?: No/denies Pain Score: 0-No pain Pain Location: Neck   Exercise/Treatments Supine Bent Knee Raise: 10 reps Bridge: 10 reps Straight Leg Raise: 10 reps Isometric Hip Flexion: 10 reps;5 seconds Prone  Single Arm Raise: 10 reps Straight Leg Raise: 10 reps    Modalities Modalities: Ultrasound Moist Heat Therapy Number Minutes Moist Heat: 20 Minutes Moist Heat Location:  (cervical and lumbar in supine) Ultrasound Ultrasound Location: B Lower thoracic/upper lumbar in prone position Ultrasound Parameters: 1.2 w/cm2 x ; 4' each side Ultrasound Goals: Pain  Physical Therapy Assessment and Plan PT Assessment and Plan Clinical Impression Statement: Pt. given written HEP for supine therex and able to demonstrate correctly.  Pt. needed minimal cues with therex for stab and control.  Focus was on lumbar therex and pain control today.  PT Plan: Continue to progress per POC with lumbar pain reduction goal.      Problem List Patient Active Problem List  Diagnosis  . Difficulty in walking  . Stiffness of joints, not elsewhere classified, multiple sites    PT - End of Session Activity Tolerance: Patient tolerated treatment well General Behavior During Session: [facility] for tasks performed Cognition: [facility] for tasks performed   Lurena Nida, PTA/CLT 06/15/2012, 2:35 PM

## 2012-06-17 ENCOUNTER — Ambulatory Visit (HOSPITAL_COMMUNITY)
Admission: RE | Admit: 2012-06-17 | Discharge: 2012-06-17 | Disposition: A | Discharge status: Home / Self Care | Payer: Worker's Compensation | Source: Ambulatory Visit | Attending: Preventative Medicine | Admitting: Preventative Medicine

## 2012-06-17 NOTE — Progress Notes (Signed)
Physical Therapy Treatment Patient Details  Name: DAIVION PAPE MRN: [id number] Date of Birth: 1991/01/07  Today's Date: 06/17/2012 Time: 8469-6295 PT Time Calculation (min): 41 min  Visit#: 6  of 6   Re-eval: 06/17/12 Charges: Therex w/MHP x 18' MMT x 1 ROMM x 1   Authorization: med assurance    Subjective: Symptoms/Limitations Symptoms: Pt states that his neck continues to feel better. His back pain is still intense. Pain Assessment Currently in Pain?: Yes Pain Score:   7 Pain Location: Back Pain Orientation: Lower  Objective: Cervical AROM Cervical Flexion: WNL was decreased 20% Cervical Extension: Decreased 30% was decreased 50% Cervical - Right Side Bend: Decreased 15% was decreased 25% Cervical - Left Side Bend: Decreased 50% was decreased 50% Cervical - Right Rotation: Decreased 15% was decreased 25% Cervical - Left Rotation: Decreased 30% was decreased 30% Cervical Strength Cervical Extension: 4/5 was 2-/5 Cervical - Right Side Bend: 4/5 was 2-/5 Cervical - Left Side Bend: 4/5 was 2-/5 Lumbar AROM Lumbar Extension: Decreased 75% was decreased 75% Lumbar - Right Side Bend: Decreased 15% was decreased 20% Lumbar - Left Side Bend: Decreased 10% was decreased 20% Lumbar - Right Rotation: Decrease 15% was decreased 20% Lumbar - Left Rotation: Decreased 20% was decreased 40%  Exercise/Treatments Stretches Single Knee to Chest Stretch: 3 reps;30 seconds Lower Trunk Rotation: 5 reps;10 seconds Supine Bridge: 10 reps Straight Leg Raise: 10 reps  Modalities Modalities: Moist Heat Moist Heat Therapy Number Minutes Moist Heat:  (During therex) Moist Heat Location:  (Lumbar)  Physical Therapy Assessment and Plan PT Assessment and Plan Clinical Impression Statement: Pt is progressing well with exercises. Pt's ROM and strength has improved but continues to be limited. Pt also continues to be limited by pain as well. Pt would benefit from continuing therapy to  improve remaining deficits. PT Plan: Recommend to continue PT.     Problem List Patient Active Problem List  Diagnosis  . Difficulty in walking  . Stiffness of joints, not elsewhere classified, multiple sites    PT - End of Session Activity Tolerance: Patient tolerated treatment well General Behavior During Session: [facility] for tasks performed Cognition: [facility] for tasks performed  Seth Bake, PTA 06/17/2012, 5:24 PM

## 2012-06-19 ENCOUNTER — Ambulatory Visit (HOSPITAL_COMMUNITY): Payer: Self-pay | Admitting: Physical Therapy

## 2012-06-21 ENCOUNTER — Encounter (HOSPITAL_COMMUNITY): Payer: Self-pay | Admitting: *Deleted

## 2012-06-21 ENCOUNTER — Emergency Department (HOSPITAL_COMMUNITY)
Admission: EM | Admit: 2012-06-21 | Discharge: 2012-06-21 | Disposition: A | Discharge status: Home / Self Care | Payer: Worker's Compensation | Attending: Emergency Medicine | Admitting: Emergency Medicine

## 2012-06-21 DIAGNOSIS — M545 Low back pain, unspecified: Secondary | ICD-10-CM | POA: Insufficient documentation

## 2012-06-21 DIAGNOSIS — Z8719 Personal history of other diseases of the digestive system: Secondary | ICD-10-CM | POA: Insufficient documentation

## 2012-06-21 DIAGNOSIS — Z79899 Other long term (current) drug therapy: Secondary | ICD-10-CM | POA: Insufficient documentation

## 2012-06-21 MED ORDER — DIAZEPAM 5 MG PO TABS
5.0000 mg | ORAL_TABLET | Freq: Once | ORAL | Status: AC
  Administered 2012-06-21: 5 mg via ORAL
  Filled 2012-06-21: qty 1

## 2012-06-21 MED ORDER — HYDROCODONE-ACETAMINOPHEN 7.5-325 MG PO TABS: 1.0000 | ORAL_TABLET | ORAL | Status: AC | PRN

## 2012-06-21 MED ORDER — KETOROLAC TROMETHAMINE 10 MG PO TABS
10.0000 mg | ORAL_TABLET | Freq: Once | ORAL | Status: AC
  Administered 2012-06-21: 10 mg via ORAL
  Filled 2012-06-21: qty 1

## 2012-06-21 MED ORDER — BACLOFEN 10 MG PO TABS: 10.0000 mg | ORAL_TABLET | Freq: Three times a day (TID) | ORAL | Status: AC

## 2012-06-21 MED ORDER — DICLOFENAC SODIUM 75 MG PO TBEC: 75.0000 mg | DELAYED_RELEASE_TABLET | Freq: Two times a day (BID) | ORAL | Status: AC

## 2012-06-21 MED ORDER — FENTANYL CITRATE 0.05 MG/ML IJ SOLN
50.0000 ug | Freq: Once | INTRAMUSCULAR | Status: AC
  Administered 2012-06-21: 50 ug via INTRAMUSCULAR
  Filled 2012-06-21: qty 2

## 2012-06-21 MED ORDER — ONDANSETRON HCL 4 MG PO TABS
4.0000 mg | ORAL_TABLET | Freq: Once | ORAL | Status: AC
  Administered 2012-06-21: 4 mg via ORAL
  Filled 2012-06-21: qty 1
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
## 2012-06-21 NOTE — ED Provider Notes (Signed)
Medical screening examination/treatment/procedure(s) were performed by non-physician practitioner and as supervising physician I was immediately available for consultation/collaboration.  Alani Sabbagh, MD 06/21/12 2055 

## 2012-06-21 NOTE — ED Notes (Signed)
Patient with no complaints at this time. Respirations even and unlabored. Skin warm/dry. Discharge instructions reviewed with patient at this time. Patient given opportunity to voice concerns/ask questions. Patient discharged at this time and left Emergency Department with steady gait.   

## 2013-05-07 ENCOUNTER — Emergency Department (HOSPITAL_COMMUNITY)
Admission: EM | Admit: 2013-05-07 | Discharge: 2013-05-07 | Disposition: A | Discharge status: Home / Self Care | Payer: No Typology Code available for payment source | Attending: Emergency Medicine | Admitting: Emergency Medicine

## 2013-05-07 ENCOUNTER — Encounter (HOSPITAL_COMMUNITY): Payer: Self-pay | Admitting: Emergency Medicine

## 2013-05-07 DIAGNOSIS — Z79899 Other long term (current) drug therapy: Secondary | ICD-10-CM | POA: Insufficient documentation

## 2013-05-07 DIAGNOSIS — H9190 Unspecified hearing loss, unspecified ear: Secondary | ICD-10-CM | POA: Insufficient documentation

## 2013-05-07 DIAGNOSIS — Z8719 Personal history of other diseases of the digestive system: Secondary | ICD-10-CM | POA: Insufficient documentation

## 2013-05-07 DIAGNOSIS — H6691 Otitis media, unspecified, right ear: Secondary | ICD-10-CM

## 2013-05-07 DIAGNOSIS — J3489 Other specified disorders of nose and nasal sinuses: Secondary | ICD-10-CM | POA: Insufficient documentation

## 2013-05-07 DIAGNOSIS — H6690 Otitis media, unspecified, unspecified ear: Secondary | ICD-10-CM | POA: Insufficient documentation

## 2013-05-07 DIAGNOSIS — Z791 Long term (current) use of non-steroidal anti-inflammatories (NSAID): Secondary | ICD-10-CM | POA: Insufficient documentation

## 2013-05-07 DIAGNOSIS — R599 Enlarged lymph nodes, unspecified: Secondary | ICD-10-CM | POA: Insufficient documentation

## 2013-05-07 MED ORDER — HYDROCODONE-ACETAMINOPHEN 5-325 MG PO TABS
1.0000 | ORAL_TABLET | Freq: Once | ORAL | Status: AC
  Administered 2013-05-07: 1 via ORAL
  Filled 2013-05-07: qty 1

## 2013-05-07 MED ORDER — AMOXICILLIN-POT CLAVULANATE 875-125 MG PO TABS
1.0000 | ORAL_TABLET | Freq: Once | ORAL | Status: AC
  Administered 2013-05-07: 1 via ORAL
  Filled 2013-05-07: qty 1

## 2013-05-07 MED ORDER — AMOXICILLIN 500 MG PO CAPS: 500.0000 mg | ORAL_CAPSULE | Freq: Three times a day (TID) | ORAL | Status: DC

## 2013-05-07 MED ORDER — ANTIPYRINE-BENZOCAINE 5.4-1.4 % OT SOLN
3.0000 [drp] | Freq: Once | OTIC | Status: AC
  Administered 2013-05-07: 4 [drp] via OTIC
  Filled 2013-05-07: qty 10

## 2013-05-07 MED ORDER — HYDROCODONE-ACETAMINOPHEN 5-325 MG PO TABS: ORAL_TABLET | ORAL | Status: DC

## 2013-05-07 NOTE — ED Provider Notes (Signed)
Medical screening examination/treatment/procedure(s) were performed by non-physician practitioner and as supervising physician I was immediately available for consultation/collaboration.  EKG Interpretation   None         Keona Sheffler, MD 05/07/13 1534 

## 2013-05-07 NOTE — ED Notes (Signed)
Earache times 2 days right ear.  States he has been up all night long .

## 2013-05-07 NOTE — ED Provider Notes (Signed)
CSN: [id number]     Arrival date & time 05/07/13  0809 History   First MD Initiated Contact with Patient 05/07/13 [phone]     Chief Complaint  Patient presents with  . Otalgia   (Consider location/radiation/quality/duration/timing/severity/associated sxs/prior Treatment) Patient is a 22 y.o. male presenting with ear pain. The history is provided by the patient.  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 and has also recently had a runny nose and nasal congestion Relieved by:  Nothing Worsened by:  Cold air Ineffective treatments:  OTC medications (heating pad, hydrogen peroxide) Associated symptoms: congestion, hearing loss and rhinorrhea   Associated symptoms: no abdominal pain, no cough, no ear discharge, no fever, no headaches, no neck pain, no rash, no sore throat, no tinnitus and no vomiting   Congestion:    Location:  Nasal   Interferes with sleep: yes     Interferes with eating/drinking: no   Risk factors: no recent travel and no prior ear surgery     Past Medical History  Diagnosis Date  . Acid reflux    Past Surgical History  Procedure Laterality Date  . Shoulder surgery     History reviewed. No pertinent family history. History  Substance Use Topics  . Smoking status: Never Smoker   . Smokeless tobacco: Not on file  . Alcohol Use: No    Review of Systems  Constitutional: Negative for fever, chills, activity change and appetite change.  HENT: Positive for congestion, ear pain, hearing loss and rhinorrhea. Negative for ear discharge, facial swelling, sore throat, tinnitus, trouble swallowing and voice change.   Eyes: Negative for visual disturbance.  Respiratory: Negative for cough, shortness of breath, wheezing and stridor.    Gastrointestinal: Negative for nausea, vomiting and abdominal pain.  Musculoskeletal: Negative for neck pain and neck stiffness.  Skin: Negative.  Negative for rash.  Neurological: Negative for dizziness, syncope, speech difficulty, weakness, numbness and headaches.  Hematological: Negative for adenopathy.  Psychiatric/Behavioral: Negative for confusion.  All other systems reviewed and are negative.    Allergies  Review of patient's allergies indicates no known allergies.  Home Medications   Current Outpatient Rx  Name  Route  Sig  Dispense  Refill  . cyclobenzaprine (FLEXERIL) 10 MG tablet   Oral   Take 1 tablet (10 mg total) by mouth 3 (three) times daily as needed for muscle spasms.   21 tablet   0   . diclofenac (VOLTAREN) 75 MG EC tablet   Oral   Take 1 tablet (75 mg total) by mouth 2 (two) times daily.   12 tablet   0   . ibuprofen (ADVIL,MOTRIN) 200 MG tablet   Oral   Take 800 mg by mouth every 6 (six) hours as needed. pain         . nabumetone (RELAFEN) 750 MG tablet   Oral   Take 750 mg by mouth 2 (two) times daily.          BP 125/100  Pulse 84  Temp(Src) 98.2 F (36.8 C) (Oral)  Resp 18  SpO2 100% Physical Exam  Nursing note and vitals reviewed. Constitutional: He is oriented to person, place, and time. He appears well-developed and well-nourished. No distress.  HENT:  Head: Normocephalic and atraumatic.  Right Ear:  No drainage. No mastoid tenderness. Tympanic membrane is erythematous and bulging. No hemotympanum. Decreased hearing is noted.  Left Ear: Tympanic membrane and ear canal normal. No mastoid tenderness. No hemotympanum.  Mouth/Throat: Uvula is midline, oropharynx is clear and moist and mucous membranes are normal. No uvula swelling. No oropharyngeal exudate.  Moderate erythema and bulging of the right TM.  Mild middle ear effusion present.  No drainage or edema of the ear canal, TM appears intact.    Eyes: Conjunctivae and EOM are  normal. Pupils are equal, round, and reactive to light.  Neck: Normal range of motion. Neck supple.  Cardiovascular: Normal rate, regular rhythm, normal heart sounds and intact distal pulses.   No murmur heard. Pulmonary/Chest: Effort normal and breath sounds normal. No stridor. No respiratory distress. He has no wheezes. He has no rales.  Musculoskeletal: Normal range of motion.  Lymphadenopathy:    He has cervical adenopathy.       Right cervical: Superficial cervical adenopathy present. No deep cervical and no posterior cervical adenopathy present.      Left cervical: No superficial cervical, no deep cervical and no posterior cervical adenopathy present.  Neurological: He is alert and oriented to person, place, and time. Coordination normal.  Skin: Skin is warm and dry. No rash noted.    ED Course  Procedures (including critical care time) Labs Review Labs Reviewed - No data to display Imaging Review No results found.  EKG Interpretation   None       MDM   Acute right OM present.  No perforation or mastoid tenderness.  tx plan includes amoxil, auralgan otic drops, #8 vicodin for pain.  Pt agrees to close f/u next week with his PMD .  VSS.  He appears stable for discharge.     Marwan Lipe L. Trisha Mangle, PA-C 05/07/13 [phone]

## 2016-08-27 ENCOUNTER — Ambulatory Visit: Payer: Self-pay | Admitting: General Surgery

## 2017-12-16 ENCOUNTER — Encounter: Payer: Self-pay | Admitting: Allergy & Immunology

## 2017-12-16 ENCOUNTER — Ambulatory Visit: Payer: BC Managed Care – PPO | Admitting: Allergy & Immunology

## 2017-12-16 VITALS — BP 130/80 | HR 98 | Temp 97.9°F | Resp 18 | Ht 72.0 in | Wt 139.0 lb

## 2017-12-16 DIAGNOSIS — J302 Other seasonal allergic rhinitis: Secondary | ICD-10-CM | POA: Diagnosis not present

## 2017-12-16 DIAGNOSIS — R05 Cough: Secondary | ICD-10-CM | POA: Diagnosis not present

## 2017-12-16 DIAGNOSIS — R059 Cough, unspecified: Secondary | ICD-10-CM

## 2017-12-16 DIAGNOSIS — J3089 Other allergic rhinitis: Secondary | ICD-10-CM | POA: Diagnosis not present

## 2017-12-16 MED ORDER — AZELASTINE HCL 0.1 % NA SOLN: 2.0000 | Freq: Two times a day (BID) | NASAL | 6 refills | Status: DC | PRN

## 2017-12-16 MED ORDER — FLUTICASONE PROPIONATE 93 MCG/ACT NA EXHU: 2.0000 | INHALANT_SUSPENSION | Freq: Two times a day (BID) | NASAL | 11 refills | Status: DC

## 2017-12-16 NOTE — Patient Instructions (Addendum)
1. Seasonal and perennial allergic rhinitis - Testing today showed: ragweed, trees, weeds, grasses, indoor molds and cockroach - Avoidance measures provided. - Stop taking: Claritin, Zyrtec, and Flonase - Start taking: Allegra (fexofenadine) 180mg  table once daily, Xhance (fluticasone) 1-2 sprays per nostril daily and Astelin (azelastine) 2 sprays per nostril 1-2 times daily as needed  - We will send in the script [facility], and they will call you to confirm your shipping address. - You can review how to use the device here: https://www.xhance.com - Ask to be enrolled in the auto-refill program so you can get a year for free. - You can use an extra dose of the antihistamine, if needed, for breakthrough symptoms.  - Consider nasal saline rinses 1-2 times daily to remove allergens from the nasal cavities as well as help with mucous clearance (this is especially helpful to do before the nasal sprays are given) - Consider allergy shots as a means of long-term control. - Allergy shots "re-train" and "reset" the immune system to ignore environmental allergens and decrease the resulting immune response to those allergens (sneezing, itchy watery eyes, runny nose, nasal congestion, etc).    - Allergy shots improve symptoms in 75-85% of patients.  - We can discuss more at the next appointment if the medications are not working for you.  2. Cough - Spirometry (lung testing) was normal. - I anticipate that your cough is from postnasal drip. - We will not add any asthma medications at this time, but may consider it at the next visit if there is no improvement with the cough.   3. Return in about 3 months (around 03/18/2018).    Please inform us of any Emergency Department visits, hospitalizations, or changes in symptoms. Call us before going to the ED for breathing or allergy symptoms since we might be able to fit you in for a sick visit. Feel free to contact us anytime with any  questions, problems, or concerns.  It was a pleasure to meet you today!  Websites that have reliable patient information: 1. American Academy of Asthma, Allergy, and Immunology: www.aaaai.org 2. Food Allergy Research and Education (FARE): foodallergy.org 3. Mothers of Asthmatics: http://www.asthmacommunitynetwork.org 4. American College of Allergy, Asthma, and Immunology: MissingWeapons.cawww.acaai.org   Make sure you are registered to vote! If you have moved or changed any of your contact information, you will need to get this updated before voting!        Reducing Pollen Exposure  The American Academy of Allergy, Asthma and Immunology suggests the following steps to reduce your exposure to pollen during allergy seasons.    1. Do not hang sheets or clothing out to dry; pollen may collect on these items. 2. Do not mow lawns or spend time around freshly cut grass; mowing stirs up pollen. 3. Keep windows closed at night.  Keep car windows closed while driving. 4. Minimize morning activities outdoors, a time when pollen counts are usually at their highest. 5. Stay indoors as much as possible when pollen counts or humidity is high and on windy days when pollen tends to remain in the air longer. 6. Use air conditioning when possible.  Many air conditioners have filters that trap the pollen spores. 7. Use a HEPA room air filter to remove pollen form the indoor air you breathe.  Control of Mold Allergen   Mold and fungi can grow on a variety of surfaces provided certain temperature and moisture conditions exist.  Outdoor molds grow on plants,  decaying vegetation and soil.  The major outdoor mold, Alternaria and Cladosporium, are found in very high numbers during hot and dry conditions.  Generally, a late Summer - Fall peak is seen for common outdoor fungal spores.  Rain will temporarily lower outdoor mold spore count, but counts rise rapidly when the rainy period ends.  The most important indoor molds are  Aspergillus and Penicillium.  Dark, humid and poorly ventilated basements are ideal sites for mold growth.  The next most common sites of mold growth are the bathroom and the kitchen.  Indoor (Perennial) Mold Control   Positive indoor molds via skin testing: Aspergillus and Penicillium  1. Maintain humidity below 50%. 2. Clean washable surfaces with 5% bleach solution. 3. Remove sources e.g. contaminated carpets.     Control of Cockroach Allergen  Cockroach allergen has been identified as an important cause of acute attacks of asthma, especially in urban settings.  There are fifty-five species of cockroach that exist in the [place], however only three, the [place], [place] species produce allergen that can affect patients with Asthma.  Allergens can be obtained from fecal particles, egg casings and secretions from cockroaches.    1. Remove food sources. 2. Reduce access to water. 3. Seal access and entry points. 4. Spray runways with 0.5-1% Diazinon or Chlorpyrifos 5. Blow boric acid power under stoves and refrigerator. 6. Place bait stations (hydramethylnon) at feeding sites.  Allergy Shots   Allergies are the result of a chain reaction that starts in the immune system. Your immune system controls how your body defends itself. For instance, if you have an allergy to pollen, your immune system identifies pollen as an invader or allergen. Your immune system overreacts by producing antibodies called Immunoglobulin E (IgE). These antibodies travel to cells that release chemicals, causing an allergic reaction.  The concept behind allergy immunotherapy, whether it is received in the form of shots or tablets, is that the immune system can be desensitized to specific allergens that trigger allergy symptoms. Although it requires time and patience, the payback can be long-term relief.  How Do Allergy Shots Work?  Allergy shots work much like a vaccine. Your body responds to  injected amounts of a particular allergen given in increasing doses, eventually developing a resistance and tolerance to it. Allergy shots can lead to decreased, minimal or no allergy symptoms.  There generally are two phases: build-up and maintenance. Build-up often ranges from three to six months and involves receiving injections with increasing amounts of the allergens. The shots are typically given once or twice a week, though more rapid build-up schedules are sometimes used.  The maintenance phase begins when the most effective dose is reached. This dose is different for each person, depending on how allergic you are and your response to the build-up injections. Once the maintenance dose is reached, there are longer periods between injections, typically two to four weeks.  Occasionally doctors give cortisone-type shots that can temporarily reduce allergy symptoms. These types of shots are different and should not be confused with allergy immunotherapy shots.  Who Can Be Treated with Allergy Shots?  Allergy shots may be a good treatment approach for people with allergic rhinitis (hay fever), allergic asthma, conjunctivitis (eye allergy) or stinging insect allergy.   Before deciding to begin allergy shots, you should consider:  . The length of allergy season and the severity of your symptoms . Whether medications and/or changes to your environment can control your symptoms . Your desire  to avoid long-term medication use . Time: allergy immunotherapy requires a major time commitment . Cost: may vary depending on your insurance coverage  Allergy shots for children age 29 and older are effective and often well tolerated. They might prevent the onset of new allergen sensitivities or the progression to asthma.  Allergy shots are not started on patients who are pregnant but can be continued on patients who become pregnant while receiving them. In some patients with other medical conditions or  who take certain common medications, allergy shots may be of risk. It is important to mention other medications you talk to your allergist.   When Will I Feel Better?  Some may experience decreased allergy symptoms during the build-up phase. For others, it may take as long as 12 months on the maintenance dose. If there is no improvement after a year of maintenance, your allergist will discuss other treatment options with you.  If you aren't responding to allergy shots, it may be because there is not enough dose of the allergen in your vaccine or there are missing allergens that were not identified during your allergy testing. Other reasons could be that there are high levels of the allergen in your environment or major exposure to non-allergic triggers like tobacco smoke.  What Is the Length of Treatment?  Once the maintenance dose is reached, allergy shots are generally continued for three to five years. The decision to stop should be discussed with your allergist at that time. Some people may experience a permanent reduction of allergy symptoms. Others may relapse and a longer course of allergy shots can be considered.  What Are the Possible Reactions?  The two types of adverse reactions that can occur with allergy shots are local and systemic. Common local reactions include very mild redness and swelling at the injection site, which can happen immediately or several hours after. A systemic reaction, which is less common, affects the entire body or a particular body system. They are usually mild and typically respond quickly to medications. Signs include increased allergy symptoms such as sneezing, a stuffy nose or hives.  Rarely, a serious systemic reaction called anaphylaxis can develop. Symptoms include swelling in the throat, wheezing, a feeling of tightness in the chest, nausea or dizziness. Most serious systemic reactions develop within 30 minutes of allergy shots. This is why it is strongly  recommended you wait in your doctor's office for 30 minutes after your injections. Your allergist is trained to watch for reactions, and his or her staff is trained and equipped with the proper medications to identify and treat them.  Who Should Administer Allergy Shots?  The preferred location for receiving shots is your prescribing allergist's office. Injections can sometimes be given at another facility where the physician and staff are trained to recognize and treat reactions, and have received instructions by your prescribing allergist.

## 2017-12-16 NOTE — Progress Notes (Signed)
NEW PATIENT  Date of Service/Encounter:  12/16/17  Referring provider: Royann Shivers, PA-C   Assessment:   Seasonal and perennial allergic rhinitis(ragweed, trees, weeds, grasses, indoor molds and cockroach)  Cough - likely secondary to postnasal drip  Plan/Recommendations:   1. Seasonal and perennial allergic rhinitis - Testing today showed: ragweed, trees, weeds, grasses, indoor molds and cockroach - Avoidance measures provided. - Stop taking: Claritin, Zyrtec, and Flonase - Start taking: Allegra (fexofenadine) 180mg  table once daily, Xhance (fluticasone) 1-2 sprays per nostril daily and Astelin (azelastine) 2 sprays per nostril 1-2 times daily as needed  - We will send in the script to the [facility] outpatient pharmacy, and they will call you to confirm your shipping address. - You can review how to use the device here: https://www.xhance.com - Ask to be enrolled in the auto-refill program so you can get a year for free. - You can use an extra dose of the antihistamine, if needed, for breakthrough symptoms.  - Consider nasal saline rinses 1-2 times daily to remove allergens from the nasal cavities as well as help with mucous clearance (this is especially helpful to do before the nasal sprays are given) - Consider allergy shots as a means of long-term control. - Allergy shots "re-train" and "reset" the immune system to ignore environmental allergens and decrease the resulting immune response to those allergens (sneezing, itchy watery eyes, runny nose, nasal congestion, etc).    - Allergy shots improve symptoms in 75-85% of patients.  - We can discuss more at the next appointment if the medications are not working for you.  2. Cough - Spirometry (lung testing) was normal. - I anticipate that your cough is from postnasal drip. - We will not add any asthma medications at this time, but may consider it at the next visit if there is no improvement with the cough.   3. Return  in about 3 months (around 03/18/2018).  Subjective:   Christopher Frey is a 27 y.o. male presenting today for evaluation of  Chief Complaint  Patient presents with  . Nasal Congestion  . Cough    Productive cough x 2 weeks    Christopher Frey has a history of the following: Patient Active Problem List   Diagnosis Date Noted  . Difficulty in walking(719.7) 06/05/2012  . Stiffness of joints, not elsewhere classified, multiple sites 06/05/2012    History obtained from: chart review and patient.  Christopher Frey was referred by Royann Shivers, PA-C.     Christopher Frey is a 27 y.o. male presenting for evaluation of allergic rhinitis.  Christopher Frey is a delightful 27 year old presenting with a history of episodes of cough with postnasal drip.  He has had allergy symptoms since he was a child, but has never been allergy tested.  He typically has exacerbations in the spring, summer, and fall.  He has tried Zyrtec and Claritin intermittently over the years, which have not helped.  He is on a nasal steroid spray which he uses consistently with some help.  He has never tried Production manager.  He has never tried as a lasting or Patanase.  During these exacerbations, he is typically treated with an antibiotic as well as prednisone.  This typically clears it up, but this past spring and summer, and has not made much of a difference.  He does endorse postnasal drip, which "spreads to [his] lungs".  He has had a chest x-ray, most recently in January 2019, which have all been  normal.  He has an albuterol inhaler, but it never seems to work.  He has never been given an official diagnosis of asthma, and has never been on a medication that he takes on a daily basis.  He denies any food allergies.  He denies urticaria or eczema.  He does not have any symptoms of anaphylaxis with stinging insects.  He does get antibiotics around 3-4 times per year, but these are only for sinus infections.  He has never had an invasive  infection and is never required IV antibiotics.  Otherwise, there is no history of other atopic diseases, including drug allergies or urticaria. There is no significant infectious history. Vaccinations are up to date.    Past Medical History: Patient Active Problem List   Diagnosis Date Noted  . Difficulty in walking(719.7) 06/05/2012  . Stiffness of joints, not elsewhere classified, multiple sites 06/05/2012    Medication List:  Allergies as of 12/16/2017   No Known Allergies     Medication List        Accurate as of 12/16/17 11:37 AM. Always use your most recent med list.          amoxicillin 500 MG capsule Commonly known as:  AMOXIL Take 1 capsule (500 mg total) by mouth 3 (three) times daily.   benzonatate 100 MG capsule Commonly known as:  TESSALON TK 1 C PO BID PRF COUGH   cefPROZIL 500 MG tablet Commonly known as:  CEFZIL TK 1 T PO  BID   chlorpheniramine-HYDROcodone 10-8 MG/5ML Suer Commonly known as:  TUSSIONEX   cyclobenzaprine 10 MG tablet Commonly known as:  FLEXERIL Take 1 tablet (10 mg total) by mouth 3 (three) times daily as needed for muscle spasms.   HYDROcodone-acetaminophen 5-325 MG tablet Commonly known as:  NORCO/VICODIN Take one-two tabs po q 4-6 hrs prn pain   ibuprofen 200 MG tablet Commonly known as:  ADVIL,MOTRIN Take 800 mg by mouth every 6 (six) hours as needed. pain   nabumetone 750 MG tablet Commonly known as:  RELAFEN Take 750 mg by mouth 2 (two) times daily.   VENTOLIN HFA 108 (90 Base) MCG/ACT inhaler Generic drug:  albuterol Inhale 2 puffs into the lungs every 6 (six) hours as needed for wheezing or shortness of breath.       Birth History: non-contributory.   Developmental History: non-contributory.   Past Surgical History: Past Surgical History:  Procedure Laterality Date  . SHOULDER SURGERY       Family History: Family History  Problem Relation Age of Onset  . Allergic rhinitis Father   . Allergic  rhinitis Brother   . Angioedema Neg Hx   . Asthma Neg Hx   . Eczema Neg Hx   . Immunodeficiency Neg Hx   . Urticaria Neg Hx      Social History: Christopher Frey lives at home with his wife and 2yo daughter.  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  In total he works around 60 hours/week.    Review of Systems: a 14-point review of systems is pertinent for what is mentioned in HPI.  Otherwise, all other systems were negative. Constitutional: negative other than that listed in the HPI Eyes: negative other than that listed in the HPI Ears, nose, mouth, throat, and face: negative other than that listed in the HPI Respiratory: negative other than that listed in the HPI Cardiovascular: negative other than that listed in the HPI  Gastrointestinal: negative other than that listed in the HPI Genitourinary: negative other than that listed in the HPI Integument: negative other than that listed in the HPI Hematologic: negative other than that listed in the HPI Musculoskeletal: negative other than that listed in the HPI Neurological: negative other than that listed in the HPI Allergy/Immunologic: negative other than that listed in the HPI    Objective:   Blood pressure 130/80, pulse 98, temperature 97.9 F (36.6 C), temperature source Oral, resp. rate 18, height 6' (1.829 m), weight 139 lb (63 kg), SpO2 97 %. Body mass index is 18.85 kg/m.   Physical Exam:  General: Alert, interactive, in no acute distress. Pleasant male. Sunburned.  Eyes: No conjunctival injection bilaterally, no discharge on the right, no discharge on the left, no Horner-Trantas dots present and allergic shiners present bilaterally. PERRL bilaterally. EOMI without pain. No photophobia.  Ears: Scarring on the bilateral TMs, Right TM pearly gray with normal light reflex, Left TM pearly gray with normal light reflex, Right TM intact without perforation  and Left TM intact without perforation.  Nose/Throat: External nose within normal limits, nasal crease present and septum midline. Turbinates edematous and pale with clear discharge. Posterior oropharynx erythematous with cobblestoning in the posterior oropharynx. Tonsils 2+ without exudates.  Tongue without thrush. Neck: Supple without thyromegaly. Trachea midline. Adenopathy: shoddy bilateral anterior cervical lymphadenopathy and no enlarged lymph nodes appreciated in the occipital, axillary, epitrochlear, inguinal, or popliteal regions. Lungs: Clear to auscultation without wheezing, rhonchi or rales. No increased work of breathing. CV: Normal S1/S2. No murmurs. Capillary refill <2 seconds.  Abdomen: Nondistended, nontender. No guarding or rebound tenderness. Bowel sounds faint and present in all fields  Skin: Warm and dry, without lesions or rashes. Extremities:  No clubbing, cyanosis or edema. Neuro:   Grossly intact. No focal deficits appreciated. Responsive to questions.  Diagnostic studies:   Spirometry: results normal (FEV1: 4.53/102%, FVC: 5.33/94%, FEV1/FVC: 84%).    Spirometry consistent with normal pattern.   Allergy Studies:   Indoor/Outdoor Percutaneous Adult Environmental Panel: negative to the entire panel with adequate controls.  Indoor/Outdoor Selected Intradermal Environmental Panel: positive to Grass mix, ragweed mix, tree mix, mold mix #2 and cockroach. Otherwise negative with adequate controls.    Allergy testing results were read and interpreted by myself, documented by clinical staff.       Malachi Bonds, MD [facility]

## 2018-03-17 ENCOUNTER — Ambulatory Visit: Payer: BC Managed Care – PPO | Admitting: Allergy & Immunology

## 2018-03-17 DIAGNOSIS — J309 Allergic rhinitis, unspecified: Secondary | ICD-10-CM

## 2018-07-21 ENCOUNTER — Ambulatory Visit (HOSPITAL_COMMUNITY)
Admission: RE | Admit: 2018-07-21 | Discharge: 2018-07-21 | Disposition: A | Discharge status: Home / Self Care | Payer: BC Managed Care – PPO | Source: Ambulatory Visit | Attending: Family Medicine | Admitting: Family Medicine

## 2018-07-21 ENCOUNTER — Other Ambulatory Visit (HOSPITAL_COMMUNITY): Payer: Self-pay | Admitting: Family Medicine

## 2018-07-21 DIAGNOSIS — M79644 Pain in right finger(s): Secondary | ICD-10-CM | POA: Insufficient documentation

## 2018-08-07 ENCOUNTER — Telehealth: Payer: Self-pay

## 2018-08-07 MED ORDER — FLUTICASONE PROPIONATE 93 MCG/ACT NA EXHU: 2.0000 | INHALANT_SUSPENSION | Freq: Two times a day (BID) | NASAL | 0 refills | Status: DC

## 2018-08-07 NOTE — Telephone Encounter (Signed)
Received fax for refill for [facility]. Rx sent in, patient will need OV for further refills.

## 2019-01-28 ENCOUNTER — Other Ambulatory Visit: Payer: Self-pay | Admitting: Family Medicine

## 2019-01-28 DIAGNOSIS — M5412 Radiculopathy, cervical region: Secondary | ICD-10-CM

## 2019-06-28 ENCOUNTER — Other Ambulatory Visit (HOSPITAL_COMMUNITY): Payer: Self-pay | Admitting: Internal Medicine

## 2019-06-28 ENCOUNTER — Ambulatory Visit (HOSPITAL_COMMUNITY)
Admission: RE | Admit: 2019-06-28 | Discharge: 2019-06-28 | Disposition: A | Discharge status: Home / Self Care | Payer: BC Managed Care – PPO | Source: Ambulatory Visit | Attending: Internal Medicine | Admitting: Internal Medicine

## 2019-06-28 ENCOUNTER — Other Ambulatory Visit: Payer: Self-pay

## 2019-06-28 DIAGNOSIS — R498 Other voice and resonance disorders: Secondary | ICD-10-CM | POA: Insufficient documentation

## 2020-07-13 ENCOUNTER — Ambulatory Visit: Payer: BC Managed Care – PPO

## 2020-12-20 DIAGNOSIS — U071 COVID-19: Secondary | ICD-10-CM | POA: Diagnosis not present

## 2021-02-09 ENCOUNTER — Encounter: Payer: Self-pay | Admitting: Emergency Medicine

## 2021-02-09 ENCOUNTER — Other Ambulatory Visit: Payer: Self-pay

## 2021-02-09 ENCOUNTER — Ambulatory Visit
Admission: EM | Admit: 2021-02-09 | Discharge: 2021-02-09 | Disposition: A | Discharge status: Home / Self Care | Payer: BC Managed Care – PPO | Attending: Emergency Medicine | Admitting: Emergency Medicine

## 2021-02-09 ENCOUNTER — Ambulatory Visit: Payer: Self-pay

## 2021-02-09 DIAGNOSIS — R0981 Nasal congestion: Secondary | ICD-10-CM

## 2021-02-09 DIAGNOSIS — R059 Cough, unspecified: Secondary | ICD-10-CM

## 2021-02-09 MED ORDER — AMOXICILLIN-POT CLAVULANATE 875-125 MG PO TABS: 1.0000 | ORAL_TABLET | Freq: Two times a day (BID) | ORAL | 0 refills | Status: AC

## 2021-02-09 NOTE — ED Triage Notes (Signed)
Nasal congestion, dry cough, yellow drainage over 1.5 weeks. Has been using sudafed.

## 2021-02-09 NOTE — ED Provider Notes (Signed)
[facility]   [id number] 02/09/21 Arrival Time: 1044   CC: COVID symptoms  SUBJECTIVE: History from: patient.  Christopher Frey is a 30 y.o. male who presents with nasal congestion, dry cough, yellow drainage x 1.5 week.  Denies sick exposure to COVID, flu or strep.  Has tried OTC medications without relief.  Denies aggravating factors. Reports previous symptoms in the past.   Denies fever, chills, sore throat, SOB, wheezing, chest pain, nausea, changes in bowel or bladder habits.    ROS: As per HPI.  All other pertinent ROS negative.     Past Medical History:  Diagnosis Date   Acid reflux    Past Surgical History:  Procedure Laterality Date   SHOULDER SURGERY     No Known Allergies No current facility-administered medications on file prior to encounter.   Current Outpatient Medications on File Prior to Encounter  Medication Sig Dispense Refill   albuterol (VENTOLIN HFA) 108 (90 Base) MCG/ACT inhaler Inhale 2 puffs into the lungs every 6 (six) hours as needed for wheezing or shortness of breath.     azelastine (ASTELIN) 0.1 % nasal spray Place 2 sprays into both nostrils 2 (two) times daily as needed for rhinitis. Use in each nostril as directed 30 mL 6   benzonatate (TESSALON) 100 MG capsule TK 1 C PO BID PRF COUGH  1   cefPROZIL (CEFZIL) 500 MG tablet TK 1 T PO  BID  0   chlorpheniramine-HYDROcodone (TUSSIONEX) 10-8 MG/5ML SUER   0   cyclobenzaprine (FLEXERIL) 10 MG tablet Take 1 tablet (10 mg total) by mouth 3 (three) times daily as needed for muscle spasms. (Patient not taking: Reported on 12/16/2017) 21 tablet 0   Fluticasone Propionate (XHANCE) 93 MCG/ACT EXHU Place 2 sprays into the nose 2 (two) times daily. 32 mL 0   HYDROcodone-acetaminophen (NORCO/VICODIN) 5-325 MG per tablet Take one-two tabs po q 4-6 hrs prn pain (Patient not taking: Reported on 12/16/2017) 8 tablet 0   ibuprofen (ADVIL,MOTRIN) 200 MG tablet Take 800 mg by mouth every 6 (six) hours as needed. pain      nabumetone (RELAFEN) 750 MG tablet Take 750 mg by mouth 2 (two) times daily.     Social History   Socioeconomic History   Marital status: Married    Spouse name: Not on file   Number of children: Not on file   Years of education: Not on file   Highest education level: Not on file  Occupational History   Not on file  Tobacco Use   Smoking status: Never   Smokeless tobacco: Current    Types: Chew  Substance and Sexual Activity   Alcohol use: No   Drug use: No   Sexual activity: Not on file  Other Topics Concern   Not on file  Social History Narrative   Not on file   Social Determinants of Health   Financial Resource Strain: Not on file  Food Insecurity: Not on file  Transportation Needs: Not on file  Physical Activity: Not on file  Stress: Not on file  Social Connections: Not on file  Intimate Partner Violence: Not on file   Family History  Problem Relation Age of Onset   Allergic rhinitis Father    Allergic rhinitis Brother    Angioedema Neg Hx    Asthma Neg Hx    Eczema Neg Hx    Immunodeficiency Neg Hx    Urticaria Neg Hx     OBJECTIVE:  Vitals:  02/09/21 1122 02/09/21 1124  BP: 124/75   Pulse: 75   Resp: 17   Temp: 97.8 F (36.6 C)   TempSrc: Oral   SpO2: 98%   Weight:  143 lb (64.9 kg)     General appearance: alert; appears fatigued, but nontoxic; speaking in full sentences and tolerating own secretions HEENT: NCAT; Ears: EACs clear, TMs pearly gray; Eyes: PERRL.  EOM grossly intact. Sinuses: nontender; Nose: nares patent without rhinorrhea, Throat: oropharynx clear, tonsils non erythematous or enlarged, uvula midline  Neck: supple without LAD Lungs: unlabored respirations, symmetrical air entry; cough: mild; no respiratory distress; CTAB Heart: regular rate and rhythm.   Skin: warm and dry Psychological: alert and cooperative; normal mood and affect  ASSESSMENT & PLAN:  1. Nasal congestion   2. Sinus congestion   3. Cough     Meds  ordered this encounter  Medications   amoxicillin-clavulanate (AUGMENTIN) 875-125 MG tablet    Sig: Take 1 tablet by mouth every 12 (twelve) hours for 10 days.    Dispense:  20 tablet    Refill:  0    Order Specific Question:   Supervising Provider    Answer:   Eustace Moore [[id number]]    COVID testing ordered.  It will take between 5-7 days for test results.  Someone will contact you regarding abnormal results.    In the meantime: You should remain isolated in your home for 5 days from symptom onset AND greater than 72 hours after symptoms resolution (absence of fever without the use of fever-reducing medication and improvement in respiratory symptoms), whichever is longer Get plenty of rest and push fluids Augmentin for sinus infection Tessalon Perles prescribed for cough Use OTC zyrtec for nasal congestion, runny nose, and/or sore throat Use OTC flonase for nasal congestion and runny nose Use medications daily for symptom relief Use OTC medications like ibuprofen or tylenol as needed fever or pain Call or go to the ED if you have any new or worsening symptoms such as fever, worsening cough, shortness of breath, chest tightness, chest pain, turning blue, changes in mental status, etc...   Reviewed expectations re: course of current medical issues. Questions answered. Outlined signs and symptoms indicating need for more acute intervention. Patient verbalized understanding. After Visit Summary given.          Rennis Harding, PA-C 02/09/21 1136

## 2021-02-09 NOTE — Discharge Instructions (Signed)
COVID testing ordered.  It will take between 5-7 days for test results.  Someone will contact you regarding abnormal results.    In the meantime: You should remain isolated in your home for 5 days from symptom onset AND greater than 72 hours after symptoms resolution (absence of fever without the use of fever-reducing medication and improvement in respiratory symptoms), whichever is longer Get plenty of rest and push fluids Augmentin for sinus infection Tessalon Perles prescribed for cough Use OTC zyrtec for nasal congestion, runny nose, and/or sore throat Use OTC flonase for nasal congestion and runny nose Use medications daily for symptom relief Use OTC medications like ibuprofen or tylenol as needed fever or pain Call or go to the ED if you have any new or worsening symptoms such as fever, worsening cough, shortness of breath, chest tightness, chest pain, turning blue, changes in mental status, etc..Marland Kitchen

## 2021-02-10 LAB — COVID-19, FLU A+B NAA
Influenza A, NAA: NOT DETECTED
Influenza B, NAA: NOT DETECTED
SARS-CoV-2, NAA: NOT DETECTED

## 2021-04-23 ENCOUNTER — Emergency Department (HOSPITAL_COMMUNITY): Payer: BC Managed Care – PPO

## 2021-04-23 ENCOUNTER — Encounter (HOSPITAL_COMMUNITY): Payer: Self-pay | Admitting: Emergency Medicine

## 2021-04-23 ENCOUNTER — Other Ambulatory Visit: Payer: Self-pay

## 2021-04-23 ENCOUNTER — Emergency Department (HOSPITAL_COMMUNITY)
Admission: EM | Admit: 2021-04-23 | Discharge: 2021-04-23 | Disposition: A | Discharge status: Home / Self Care | Payer: BC Managed Care – PPO | Attending: Emergency Medicine | Admitting: Emergency Medicine

## 2021-04-23 ENCOUNTER — Telehealth: Payer: Self-pay | Admitting: Internal Medicine

## 2021-04-23 DIAGNOSIS — K279 Peptic ulcer, site unspecified, unspecified as acute or chronic, without hemorrhage or perforation: Secondary | ICD-10-CM | POA: Insufficient documentation

## 2021-04-23 DIAGNOSIS — R059 Cough, unspecified: Secondary | ICD-10-CM | POA: Diagnosis not present

## 2021-04-23 DIAGNOSIS — R1013 Epigastric pain: Secondary | ICD-10-CM | POA: Diagnosis not present

## 2021-04-23 DIAGNOSIS — R000 Tachycardia, unspecified: Secondary | ICD-10-CM | POA: Diagnosis not present

## 2021-04-23 DIAGNOSIS — R079 Chest pain, unspecified: Secondary | ICD-10-CM | POA: Diagnosis not present

## 2021-04-23 DIAGNOSIS — F17220 Nicotine dependence, chewing tobacco, uncomplicated: Secondary | ICD-10-CM | POA: Insufficient documentation

## 2021-04-23 DIAGNOSIS — R0789 Other chest pain: Secondary | ICD-10-CM | POA: Diagnosis not present

## 2021-04-23 LAB — HEPATIC FUNCTION PANEL
ALT: 14 U/L (ref 0–44)
AST: 24 U/L (ref 15–41)
Albumin: 4.2 g/dL (ref 3.5–5.0)
Alkaline Phosphatase: 46 U/L (ref 38–126)
Bilirubin, Direct: 0.1 mg/dL (ref 0.0–0.2)
Indirect Bilirubin: 0.5 mg/dL (ref 0.3–0.9)
Total Bilirubin: 0.6 mg/dL (ref 0.3–1.2)
Total Protein: 7.1 g/dL (ref 6.5–8.1)

## 2021-04-23 LAB — CBC
HCT: 46.6 % (ref 39.0–52.0)
Hemoglobin: 15.2 g/dL (ref 13.0–17.0)
MCH: 27.5 pg (ref 26.0–34.0)
MCHC: 32.6 g/dL (ref 30.0–36.0)
MCV: 84.4 fL (ref 80.0–100.0)
Platelets: 124 10*3/uL — ABNORMAL LOW (ref 150–400)
RBC: 5.52 MIL/uL (ref 4.22–5.81)
RDW: 12.7 % (ref 11.5–15.5)
WBC: 5.4 10*3/uL (ref 4.0–10.5)
nRBC: 0 % (ref 0.0–0.2)

## 2021-04-23 LAB — BASIC METABOLIC PANEL
Anion gap: 6 (ref 5–15)
BUN: 18 mg/dL (ref 6–20)
CO2: 31 mmol/L (ref 22–32)
Calcium: 8.7 mg/dL — ABNORMAL LOW (ref 8.9–10.3)
Chloride: 99 mmol/L (ref 98–111)
Creatinine, Ser: 1.1 mg/dL (ref 0.61–1.24)
GFR, Estimated: >60 mL/min (ref >60)
Glucose, Bld: 161 mg/dL — ABNORMAL HIGH (ref 70–99)
Potassium: 4 mmol/L (ref 3.5–5.1)
Sodium: 136 mmol/L (ref 135–145)

## 2021-04-23 LAB — TROPONIN I (HIGH SENSITIVITY)
Troponin I (High Sensitivity): <2 ng/L (ref <18)
Troponin I (High Sensitivity): <2 ng/L (ref <18)

## 2021-04-23 LAB — LIPASE, BLOOD: Lipase: 27 U/L (ref 11–51)

## 2021-04-23 MED ORDER — LIDOCAINE VISCOUS HCL 2 % MT SOLN
15.0000 mL | Freq: Once | OROMUCOSAL | Status: AC
  Administered 2021-04-23: 15 mL via ORAL
  Filled 2021-04-23: qty 15

## 2021-04-23 MED ORDER — PANTOPRAZOLE SODIUM 20 MG PO TBEC: 20.0000 mg | DELAYED_RELEASE_TABLET | Freq: Two times a day (BID) | ORAL | 0 refills | Status: DC

## 2021-04-23 MED ORDER — KETOROLAC TROMETHAMINE 30 MG/ML IJ SOLN
30.0000 mg | Freq: Once | INTRAMUSCULAR | Status: AC
  Administered 2021-04-23: 30 mg via INTRAMUSCULAR
  Filled 2021-04-23: qty 1

## 2021-04-23 MED ORDER — SUCRALFATE 1 G PO TABS: 1.0000 g | ORAL_TABLET | Freq: Three times a day (TID) | ORAL | 0 refills | Status: DC

## 2021-04-23 MED ORDER — ALUM & MAG HYDROXIDE-SIMETH 200-200-20 MG/5ML PO SUSP
30.0000 mL | Freq: Once | ORAL | Status: AC
  Administered 2021-04-23: 30 mL via ORAL
  Filled 2021-04-23: qty 30

## 2021-04-23 NOTE — Telephone Encounter (Signed)
Er referral  ?

## 2021-04-23 NOTE — Discharge Instructions (Addendum)
We suspect that your symptoms are because of gastritis.  Take the medications prescribed.  As discussed, there is a small suspicion for pericarditis.  Read instructions provided for it, but initial thought is that this is less likely to be it.  Return to the ER if you start having fevers, shortness of breath, bloody vomit or stools.

## 2021-04-23 NOTE — ED Triage Notes (Signed)
Pt reports sudden onset of central chest pain, SOB, n/v, diaphoresis approx 1 hour ago. Called EMS and was told ekg looked reassuring. Chose to have family transport to hospital instead of EMS. Reports pain on inspiration.

## 2021-04-23 NOTE — ED Provider Notes (Signed)
[facility] EMERGENCY DEPARTMENT Provider Note   CSN: [id number] Arrival date & time: 04/23/21  [id number]     History Chief Complaint  Patient presents with   Chest Pain    Christopher Frey is a 30 y.o. male.  HPI  HPI: A 30 year old patient presents for evaluation of chest pain. Initial onset of pain was less than one hour ago. The patient's chest pain is described as heaviness/pressure/tightness and is not worse with exertion. The patient reports some diaphoresis. The patient's chest pain is middle- or left-sided, is not well-localized, is not sharp and does not radiate to the arms/jaw/neck. The patient does not complain of nausea. The patient has no history of stroke, has no history of peripheral artery disease, has not smoked in the past 90 days, denies any history of treated diabetes, has no relevant family history of coronary artery disease (first degree relative at less than age 23), is not hypertensive, has no history of hypercholesterolemia and does not have an elevated BMI (>=30).    Pain started suddenly this morning.  Patient reports that the pain was excruciating, he started sweating.  Pain is worse with laying flat, better with sitting up and it is also worse with deep inspiration.  No cough.  No history of PE, DVT.  Patient does not smoke, does not drink heavy alcohol, denies any heavy NSAID use.  No family history of IBD or premature CAD.  Past Medical History:  Diagnosis Date   Acid reflux     Patient Active Problem List   Diagnosis Date Noted   Difficulty in walking(719.7) 06/05/2012   Stiffness of joints, not elsewhere classified, multiple sites 06/05/2012    Past Surgical History:  Procedure Laterality Date   SHOULDER SURGERY         Family History  Problem Relation Age of Onset   Allergic rhinitis Father    Allergic rhinitis Brother    Angioedema Neg Hx    Asthma Neg Hx    Eczema Neg Hx    Immunodeficiency Neg Hx    Urticaria Neg Hx     Social History    Tobacco Use   Smoking status: Never   Smokeless tobacco: Current    Types: Chew  Vaping Use   Vaping Use: Never used  Substance Use Topics   Alcohol use: Yes    Alcohol/week: 2.0 - 3.0 standard drinks    Types: 2 - 3 Cans of beer per week   Drug use: No    Home Medications Prior to Admission medications   Medication Sig Start Date End Date Taking? Authorizing Provider  pantoprazole (PROTONIX) 20 MG tablet Take 1 tablet (20 mg total) by mouth 2 (two) times daily before a meal. 04/23/21  Yes [name], MD  sucralfate (CARAFATE) 1 g tablet Take 1 tablet (1 g total) by mouth 4 (four) times daily -  with meals and at bedtime. 04/23/21  Yes [name], MD  azelastine (ASTELIN) 0.1 % nasal spray Place 2 sprays into both nostrils 2 (two) times daily as needed for rhinitis. Use in each nostril as directed Patient not taking: Reported on 04/23/2021 12/16/17   [name], MD  cyclobenzaprine (FLEXERIL) 10 MG tablet Take 1 tablet (10 mg total) by mouth 3 (three) times daily as needed for muscle spasms. Patient not taking: Reported on 12/16/2017 05/20/12   [name], PA-C  Fluticasone Propionate Truett Perna) 93 MCG/ACT EXHU Place 2 sprays into the nose 2 (two) times daily. Patient not taking:  Reported on 04/23/2021 08/07/18   [name], MD  HYDROcodone-acetaminophen (NORCO/VICODIN) 5-325 MG per tablet Take one-two tabs po q 4-6 hrs prn pain Patient not taking: Reported on 12/16/2017 05/07/13   [name], PA-C    Allergies    Patient has no known allergies.  Review of Systems   Review of Systems  Constitutional:  Positive for activity change.  Cardiovascular:  Positive for chest pain.  All other systems reviewed and are negative.  Physical Exam Updated Vital Signs BP 121/88   Pulse 96   Temp 97.6 F (36.4 C) (Oral)   Resp 13   Ht 6\' 1"  (1.854 m)   Wt 66.3 kg   SpO2 98%   BMI 19.28 kg/m   Physical Exam Vitals and nursing note reviewed.   Constitutional:      Appearance: He is well-developed.  HENT:     Head: Atraumatic.  Cardiovascular:     Rate and Rhythm: Normal rate.     Heart sounds: Normal heart sounds. Heart sounds not distant. No murmur heard.   No friction rub.  Pulmonary:     Effort: Pulmonary effort is normal.     Breath sounds: No wheezing.  Abdominal:     Comments: Epigastric tenderness  Musculoskeletal:     Cervical back: Neck supple.  Skin:    General: Skin is warm.  Neurological:     Mental Status: He is alert and oriented to person, place, and time.    ED Results / Procedures / Treatments   Labs (all labs ordered are listed, but only abnormal results are displayed) Labs Reviewed  BASIC METABOLIC PANEL - Abnormal; Notable for the following components:      Result Value   Glucose, Bld 161 (*)    Calcium 8.7 (*)    All other components within normal limits  CBC - Abnormal; Notable for the following components:   Platelets 124 (*)    All other components within normal limits  HEPATIC FUNCTION PANEL  LIPASE, BLOOD  TROPONIN I (HIGH SENSITIVITY)  TROPONIN I (HIGH SENSITIVITY)    EKG EKG Interpretation  Date/Time:  Monday April 23 2021 08:32:31 EST Ventricular Rate:  85 PR Interval:  128 QRS Duration: 94 QT Interval:  354 QTC Calculation: 421 R Axis:   88 Text Interpretation: Normal sinus rhythm with sinus arrhythmia Normal ECG No acute changes No significant change since last tracing Confirmed by Varney Biles [phone]) on 04/23/2021 11:51:31 AM  Radiology DG Chest 2 View  Result Date: 04/23/2021 CLINICAL DATA:  Chest pain, cough EXAM: CHEST - 2 VIEW COMPARISON:  None. FINDINGS: The heart size and mediastinal contours are within normal limits. Both lungs are clear. The visualized skeletal structures are unremarkable. IMPRESSION: No active cardiopulmonary disease. Electronically Signed   By: Davina Poke D.O.   On: 04/23/2021 08:50    Procedures Procedures   Medications  Ordered in ED Medications  alum & mag hydroxide-simeth (MAALOX/MYLANTA) 200-200-20 MG/5ML suspension 30 mL (30 mLs Oral Given 04/23/21 0957)    And  lidocaine (XYLOCAINE) 2 % viscous mouth solution 15 mL (15 mLs Oral Given 04/23/21 0957)  ketorolac (TORADOL) 30 MG/ML injection 30 mg (30 mg Intramuscular Given 04/23/21 0957)    ED Course  I have reviewed the triage vital signs and the nursing notes.  Pertinent labs & imaging results that were available during my care of the patient were reviewed by me and considered in my medical decision making (see chart for details).  MDM Rules/Calculators/A&P HEAR Score: 2                         DDx includes: Pancreatitis Hepatobiliary pathology including cholecystitis Gastritis/PUD SBO ACS syndrome Aortic Dissection  30 year old comes in with chief complaint of epigastric chest pain.  Sudden onset chest pain, with associated sweating.  Pain is pleuritic and worse when laying flat.  EKG is not showing clear evidence of pericarditis.  Chest exam did not reveal any pericardial friction or rub.  I am considering the diagnosis to be likely pericarditis versus peptic ulcer disease.  Patient will receive IM Toradol along with GI cocktail.  Reassessment: Delta troponins are negative.  White count is normal. On exam, continues to have epigastric tenderness right below the sternal notch.  Although there are some atypical findings such as positional pain, pleurisy -clinically, this does not appear to be pericarditis.  Patient is PERC negative.   Plan is to treat this like a peptic ulcer disease and have patient follow-up with GI.  He reports that he was diagnosed with acid reflux years back, but has not had any issues with that recently.  Strict ER return precautions have been discussed, and patient is agreeing with the plan and is comfortable with the workup done and the recommendations from the ER.   Final Clinical Impression(s) / ED  Diagnoses Final diagnoses:  PUD (peptic ulcer disease)    Rx / DC Orders ED Discharge Orders          Ordered    pantoprazole (PROTONIX) 20 MG tablet  2 times daily before meals        04/23/21 1156    sucralfate (CARAFATE) 1 g tablet  3 times daily with meals & bedtime        04/23/21 1156             Derwood Kaplan, MD 04/23/21 1201

## 2021-04-24 DIAGNOSIS — J111 Influenza due to unidentified influenza virus with other respiratory manifestations: Secondary | ICD-10-CM | POA: Diagnosis not present

## 2021-05-03 ENCOUNTER — Encounter: Payer: Self-pay | Admitting: Internal Medicine

## 2021-05-03 ENCOUNTER — Other Ambulatory Visit: Payer: Self-pay

## 2021-05-03 ENCOUNTER — Ambulatory Visit: Payer: BC Managed Care – PPO | Admitting: Internal Medicine

## 2021-05-03 VITALS — BP 129/83 | HR 81 | Temp 97.7°F | Ht 73.0 in | Wt 146.2 lb

## 2021-05-03 DIAGNOSIS — R1013 Epigastric pain: Secondary | ICD-10-CM | POA: Diagnosis not present

## 2021-05-03 DIAGNOSIS — R61 Generalized hyperhidrosis: Secondary | ICD-10-CM

## 2021-05-03 DIAGNOSIS — K219 Gastro-esophageal reflux disease without esophagitis: Secondary | ICD-10-CM

## 2021-05-03 MED ORDER — PANTOPRAZOLE SODIUM 20 MG PO TBEC: 20.0000 mg | DELAYED_RELEASE_TABLET | Freq: Two times a day (BID) | ORAL | 2 refills | Status: DC

## 2021-05-03 NOTE — Progress Notes (Signed)
Primary Care Physician:  Ladon Applebaum Primary Gastroenterologist:  Dr. Marletta Lor  Chief Complaint  Patient presents with   Abdominal Pain    Mid upper abd. Seen in ED. Started Pantoprazole and Sucralfate-helping little    HPI:   Christopher Frey is a 30 y.o. male who presents to the clinic today as a new patient for ER follow-up visit.  Patient states he has had GI issues  off and on his whole life.  Notes he had sudden onset of epigastric pain 10 days ago in the morning.  States this pain was severe and radiated to his back with associated diaphoresis.  Reported to the ER, cardiac work-up unremarkable.  Was given GI cocktail and discharged home on pantoprazole 20 mg twice daily and Carafate.  No abdominal imaging performed.  Lipase WNL.  Today he states his pain is improved though he still feels sore in the epigastric region.  No chronic NSAID use.  No history of H. pylori or PUD that he knows of.  No melena hematochezia.  No previous endoscopic evaluation.  Past Medical History:  Diagnosis Date   Acid reflux     Past Surgical History:  Procedure Laterality Date   SHOULDER SURGERY      Current Outpatient Medications  Medication Sig Dispense Refill   pantoprazole (PROTONIX) 20 MG tablet Take 1 tablet (20 mg total) by mouth 2 (two) times daily before a meal. 60 tablet 0   sucralfate (CARAFATE) 1 g tablet Take 1 tablet (1 g total) by mouth 4 (four) times daily -  with meals and at bedtime. 90 tablet 0   azelastine (ASTELIN) 0.1 % nasal spray Place 2 sprays into both nostrils 2 (two) times daily as needed for rhinitis. Use in each nostril as directed (Patient not taking: Reported on 04/23/2021) 30 mL 6   cyclobenzaprine (FLEXERIL) 10 MG tablet Take 1 tablet (10 mg total) by mouth 3 (three) times daily as needed for muscle spasms. (Patient not taking: Reported on 12/16/2017) 21 tablet 0   Fluticasone Propionate (XHANCE) 93 MCG/ACT EXHU Place 2 sprays into the nose 2 (two) times  daily. (Patient not taking: Reported on 04/23/2021) 32 mL 0   HYDROcodone-acetaminophen (NORCO/VICODIN) 5-325 MG per tablet Take one-two tabs po q 4-6 hrs prn pain (Patient not taking: Reported on 12/16/2017) 8 tablet 0   No current facility-administered medications for this visit.    Allergies as of 05/03/2021   (No Known Allergies)    Family History  Problem Relation Age of Onset   Allergic rhinitis Father    Allergic rhinitis Brother    Angioedema Neg Hx    Asthma Neg Hx    Eczema Neg Hx    Immunodeficiency Neg Hx    Urticaria Neg Hx     Social History   Socioeconomic History   Marital status: Married    Spouse name: Not on file   Number of children: Not on file   Years of education: Not on file   Highest education level: Not on file  Occupational History   Not on file  Tobacco Use   Smoking status: Never   Smokeless tobacco: Current    Types: Chew  Vaping Use   Vaping Use: Never used  Substance and Sexual Activity   Alcohol use: Yes    Alcohol/week: 2.0 - 3.0 standard drinks    Types: 2 - 3 Cans of beer per week   Drug use: No   Sexual activity: Not  on file  Other Topics Concern   Not on file  Social History Narrative   Not on file   Social Determinants of Health   Financial Resource Strain: Not on file  Food Insecurity: Not on file  Transportation Needs: Not on file  Physical Activity: Not on file  Stress: Not on file  Social Connections: Not on file  Intimate Partner Violence: Not on file    Subjective: Review of Systems  Constitutional:  Negative for chills and fever.  HENT:  Negative for congestion and hearing loss.   Eyes:  Negative for blurred vision and double vision.  Respiratory:  Negative for cough and shortness of breath.   Cardiovascular:  Negative for chest pain and palpitations.  Gastrointestinal:  Positive for abdominal pain and heartburn. Negative for blood in stool, constipation, diarrhea, melena and vomiting.  Genitourinary:   Negative for dysuria and urgency.  Musculoskeletal:  Negative for joint pain and myalgias.  Skin:  Negative for itching and rash.  Neurological:  Negative for dizziness and headaches.  Psychiatric/Behavioral:  Negative for depression. The patient is not nervous/anxious.       Objective: BP 129/83   Pulse 81   Temp 97.7 F (36.5 C) (Temporal)   Ht 6\' 1"  (1.854 m)   Wt 146 lb 3.2 oz (66.3 kg)   BMI 19.29 kg/m  Physical Exam Constitutional:      Appearance: Normal appearance.  HENT:     Head: Normocephalic and atraumatic.  Eyes:     Extraocular Movements: Extraocular movements intact.     Conjunctiva/sclera: Conjunctivae normal.  Cardiovascular:     Rate and Rhythm: Normal rate and regular rhythm.  Pulmonary:     Effort: Pulmonary effort is normal.     Breath sounds: Normal breath sounds.  Abdominal:     General: Bowel sounds are normal.     Palpations: Abdomen is soft.  Musculoskeletal:        General: Normal range of motion.     Cervical back: Normal range of motion and neck supple.  Skin:    General: Skin is warm.  Neurological:     General: No focal deficit present.     Mental Status: He is alert and oriented to person, place, and time.  Psychiatric:        Mood and Affect: Mood normal.        Behavior: Behavior normal.     Assessment: *Epigastric pain *GERD  Plan: Will schedule for EGD to evaluate for peptic ulcer disease, esophagitis, gastritis, H. Pylori, duodenitis, or other. Will also evaluate for esophageal stricture, Schatzki's ring, esophageal web or other.   The risks including infection, bleed, or perforation as well as benefits, limitations, alternatives and imponderables have been reviewed with the patient. Potential for esophageal dilation, biopsy, etc. have also been reviewed.  Questions have been answered. All parties agreeable.  If EGD unremarkable, will consider imaging to further evaluate.   Continue on pantoprazole 20 mg twice daily.  I  have sent in refills today.  He can take Carafate on top of this as needed.  Further recommendations to follow.  05/03/2021 2:26 PM   Disclaimer: This note was dictated with voice recognition software. Similar sounding words can inadvertently be transcribed and may not be corrected upon review.

## 2021-05-03 NOTE — Patient Instructions (Addendum)
We will schedule you for upper endoscopy to further evaluate your epigastric pain and heartburn.  We may need to consider further imaging if endoscopy is unremarkable.  If you have resurgence of your abdominal pain then please call our office and I will order a CT scan to be done immediately.  Continue on pantoprazole twice daily.  You can take the sucralfate on top of this as needed.  Further recommendations to follow.  It was nice meeting you today.  Dr. Marletta Lor  At [facility] we value your feedback. You may receive a survey about your visit today. Please share your experience as we strive to create trusting relationships with our patients to provide genuine, compassionate, quality care.  We appreciate your understanding and patience as we review any laboratory studies, imaging, and other diagnostic tests that are ordered as we care for you. Our office policy is 5 business days for review of these results, and any emergent or urgent results are addressed in a timely manner for your best interest. If you do not hear from our office in 1 week, please contact us.   We also encourage the use of MyChart, which contains your medical information for your review as well. If you are not enrolled in this feature, an access code is on this after visit summary for your convenience. Thank you for allowing [place] to be involved in your care.  It was great to see you today!  I hope you have a great rest of your Fall!    Hennie Duos. Marletta Lor, D.O. Gastroenterology and Hepatology [facility]

## 2021-05-03 NOTE — H&P (View-Only) (Signed)
Primary Care Physician:  Ladon Applebaum Primary Gastroenterologist:  Dr. Marletta Lor  Chief Complaint  Patient presents with   Abdominal Pain    Mid upper abd. Seen in ED. Started Pantoprazole and Sucralfate-helping little    HPI:   Christopher Frey is a 30 y.o. male who presents to the clinic today as a new patient for ER follow-up visit.  Patient states he has had GI issues  off and on his whole life.  Notes he had sudden onset of epigastric pain 10 days ago in the morning.  States this pain was severe and radiated to his back with associated diaphoresis.  Reported to the ER, cardiac work-up unremarkable.  Was given GI cocktail and discharged home on pantoprazole 20 mg twice daily and Carafate.  No abdominal imaging performed.  Lipase WNL.  Today he states his pain is improved though he still feels sore in the epigastric region.  No chronic NSAID use.  No history of H. pylori or PUD that he knows of.  No melena hematochezia.  No previous endoscopic evaluation.  Past Medical History:  Diagnosis Date   Acid reflux     Past Surgical History:  Procedure Laterality Date   SHOULDER SURGERY      Current Outpatient Medications  Medication Sig Dispense Refill   pantoprazole (PROTONIX) 20 MG tablet Take 1 tablet (20 mg total) by mouth 2 (two) times daily before a meal. 60 tablet 0   sucralfate (CARAFATE) 1 g tablet Take 1 tablet (1 g total) by mouth 4 (four) times daily -  with meals and at bedtime. 90 tablet 0   azelastine (ASTELIN) 0.1 % nasal spray Place 2 sprays into both nostrils 2 (two) times daily as needed for rhinitis. Use in each nostril as directed (Patient not taking: Reported on 04/23/2021) 30 mL 6   cyclobenzaprine (FLEXERIL) 10 MG tablet Take 1 tablet (10 mg total) by mouth 3 (three) times daily as needed for muscle spasms. (Patient not taking: Reported on 12/16/2017) 21 tablet 0   Fluticasone Propionate (XHANCE) 93 MCG/ACT EXHU Place 2 sprays into the nose 2 (two) times  daily. (Patient not taking: Reported on 04/23/2021) 32 mL 0   HYDROcodone-acetaminophen (NORCO/VICODIN) 5-325 MG per tablet Take one-two tabs po q 4-6 hrs prn pain (Patient not taking: Reported on 12/16/2017) 8 tablet 0   No current facility-administered medications for this visit.    Allergies as of 05/03/2021   (No Known Allergies)    Family History  Problem Relation Age of Onset   Allergic rhinitis Father    Allergic rhinitis Brother    Angioedema Neg Hx    Asthma Neg Hx    Eczema Neg Hx    Immunodeficiency Neg Hx    Urticaria Neg Hx     Social History   Socioeconomic History   Marital status: Married    Spouse name: Not on file   Number of children: Not on file   Years of education: Not on file   Highest education level: Not on file  Occupational History   Not on file  Tobacco Use   Smoking status: Never   Smokeless tobacco: Current    Types: Chew  Vaping Use   Vaping Use: Never used  Substance and Sexual Activity   Alcohol use: Yes    Alcohol/week: 2.0 - 3.0 standard drinks    Types: 2 - 3 Cans of beer per week   Drug use: No   Sexual activity: Not  on file  Other Topics Concern   Not on file  Social History Narrative   Not on file   Social Determinants of Health   Financial Resource Strain: Not on file  Food Insecurity: Not on file  Transportation Needs: Not on file  Physical Activity: Not on file  Stress: Not on file  Social Connections: Not on file  Intimate Partner Violence: Not on file    Subjective: Review of Systems  Constitutional:  Negative for chills and fever.  HENT:  Negative for congestion and hearing loss.   Eyes:  Negative for blurred vision and double vision.  Respiratory:  Negative for cough and shortness of breath.   Cardiovascular:  Negative for chest pain and palpitations.  Gastrointestinal:  Positive for abdominal pain and heartburn. Negative for blood in stool, constipation, diarrhea, melena and vomiting.  Genitourinary:   Negative for dysuria and urgency.  Musculoskeletal:  Negative for joint pain and myalgias.  Skin:  Negative for itching and rash.  Neurological:  Negative for dizziness and headaches.  Psychiatric/Behavioral:  Negative for depression. The patient is not nervous/anxious.       Objective: BP 129/83   Pulse 81   Temp 97.7 F (36.5 C) (Temporal)   Ht 6\' 1"  (1.854 m)   Wt 146 lb 3.2 oz (66.3 kg)   BMI 19.29 kg/m  Physical Exam Constitutional:      Appearance: Normal appearance.  HENT:     Head: Normocephalic and atraumatic.  Eyes:     Extraocular Movements: Extraocular movements intact.     Conjunctiva/sclera: Conjunctivae normal.  Cardiovascular:     Rate and Rhythm: Normal rate and regular rhythm.  Pulmonary:     Effort: Pulmonary effort is normal.     Breath sounds: Normal breath sounds.  Abdominal:     General: Bowel sounds are normal.     Palpations: Abdomen is soft.  Musculoskeletal:        General: Normal range of motion.     Cervical back: Normal range of motion and neck supple.  Skin:    General: Skin is warm.  Neurological:     General: No focal deficit present.     Mental Status: He is alert and oriented to person, place, and time.  Psychiatric:        Mood and Affect: Mood normal.        Behavior: Behavior normal.     Assessment: *Epigastric pain *GERD  Plan: Will schedule for EGD to evaluate for peptic ulcer disease, esophagitis, gastritis, H. Pylori, duodenitis, or other. Will also evaluate for esophageal stricture, Schatzki's ring, esophageal web or other.   The risks including infection, bleed, or perforation as well as benefits, limitations, alternatives and imponderables have been reviewed with the patient. Potential for esophageal dilation, biopsy, etc. have also been reviewed.  Questions have been answered. All parties agreeable.  If EGD unremarkable, will consider imaging to further evaluate.   Continue on pantoprazole 20 mg twice daily.  I  have sent in refills today.  He can take Carafate on top of this as needed.  Further recommendations to follow.  05/03/2021 2:26 PM   Disclaimer: This note was dictated with voice recognition software. Similar sounding words can inadvertently be transcribed and may not be corrected upon review.

## 2021-05-08 ENCOUNTER — Encounter (HOSPITAL_COMMUNITY): Payer: Self-pay

## 2021-05-08 ENCOUNTER — Ambulatory Visit (HOSPITAL_COMMUNITY)
Admission: RE | Admit: 2021-05-08 | Discharge: 2021-05-08 | Disposition: A | Discharge status: Home / Self Care | Payer: BC Managed Care – PPO | Attending: Internal Medicine | Admitting: Internal Medicine

## 2021-05-08 ENCOUNTER — Other Ambulatory Visit: Payer: Self-pay

## 2021-05-08 ENCOUNTER — Ambulatory Visit (HOSPITAL_COMMUNITY): Payer: BC Managed Care – PPO | Admitting: Anesthesiology

## 2021-05-08 ENCOUNTER — Encounter (HOSPITAL_COMMUNITY)
Admission: RE | Disposition: A | Discharge status: Home / Self Care | Payer: Self-pay | Source: Home / Self Care | Attending: Internal Medicine

## 2021-05-08 ENCOUNTER — Telehealth: Payer: Self-pay

## 2021-05-08 DIAGNOSIS — K3189 Other diseases of stomach and duodenum: Secondary | ICD-10-CM | POA: Diagnosis not present

## 2021-05-08 DIAGNOSIS — K2970 Gastritis, unspecified, without bleeding: Secondary | ICD-10-CM | POA: Insufficient documentation

## 2021-05-08 DIAGNOSIS — Z79899 Other long term (current) drug therapy: Secondary | ICD-10-CM | POA: Diagnosis not present

## 2021-05-08 DIAGNOSIS — R1013 Epigastric pain: Secondary | ICD-10-CM | POA: Insufficient documentation

## 2021-05-08 DIAGNOSIS — K219 Gastro-esophageal reflux disease without esophagitis: Secondary | ICD-10-CM | POA: Diagnosis not present

## 2021-05-08 HISTORY — PX: ESOPHAGOGASTRODUODENOSCOPY (EGD) WITH PROPOFOL: SHX5813

## 2021-05-08 HISTORY — PX: BIOPSY: SHX5522

## 2021-05-08 SURGERY — ESOPHAGOGASTRODUODENOSCOPY (EGD) WITH PROPOFOL
Anesthesia: General

## 2021-05-08 MED ORDER — LACTATED RINGERS IV SOLN
INTRAVENOUS | Status: DC
  Administered 2021-05-08: 1000 mL via INTRAVENOUS

## 2021-05-08 MED ORDER — PANTOPRAZOLE SODIUM 40 MG PO TBEC: 40.0000 mg | DELAYED_RELEASE_TABLET | Freq: Two times a day (BID) | ORAL | 5 refills | Status: DC

## 2021-05-08 MED ORDER — LIDOCAINE HCL (CARDIAC) PF 100 MG/5ML IV SOSY
PREFILLED_SYRINGE | INTRAVENOUS | Status: DC | PRN
  Administered 2021-05-08: 50 mg via INTRAVENOUS

## 2021-05-08 MED ORDER — PROPOFOL 10 MG/ML IV BOLUS
INTRAVENOUS | Status: DC | PRN
  Administered 2021-05-08: 50 mg via INTRAVENOUS
  Administered 2021-05-08: 150 mg via INTRAVENOUS

## 2021-05-08 NOTE — Anesthesia Postprocedure Evaluation (Signed)
Anesthesia Post Note  Patient: Christopher Frey  Procedure(s) Performed: ESOPHAGOGASTRODUODENOSCOPY (EGD) WITH PROPOFOL BIOPSY  Patient location during evaluation: Endoscopy Anesthesia Type: General Level of consciousness: awake and alert and oriented Pain management: pain level controlled Vital Signs Assessment: post-procedure vital signs reviewed and stable Respiratory status: spontaneous breathing, nonlabored ventilation and respiratory function stable Cardiovascular status: blood pressure returned to baseline and stable Postop Assessment: no apparent nausea or vomiting Anesthetic complications: no   No notable events documented.   Last Vitals:  Vitals:   05/08/21 0917 05/08/21 1018  BP: (!) 139/94 (!) 90/57  Pulse: 76   Resp: 20 18  Temp: 36.8 C 36.8 C  SpO2: 98% 97%    Last Pain:  Vitals:   05/08/21 1018  TempSrc: Oral  PainSc: 7                  Divon Krabill C Sinead Hockman

## 2021-05-08 NOTE — OR Nursing (Signed)
Christopher Frey had a procedure at [facility] on 05/08/21 and cannot return to work until 11:00am on12/7/22.

## 2021-05-08 NOTE — Discharge Instructions (Addendum)
EGD Discharge instructions Please read the instructions outlined below and refer to this sheet in the next few weeks. These discharge instructions provide you with general information on caring for yourself after you leave the hospital. Your doctor may also give you specific instructions. While your treatment has been planned according to the most current medical practices available, unavoidable complications occasionally occur. If you have any problems or questions after discharge, please call your doctor. ACTIVITY You may resume your regular activity but move at a slower pace for the next 24 hours.  Take frequent rest periods for the next 24 hours.  Walking will help expel (get rid of) the air and reduce the bloated feeling in your abdomen.  No driving for 24 hours (because of the anesthesia (medicine) used during the test).  You may shower.  Do not sign any important legal documents or operate any machinery for 24 hours (because of the anesthesia used during the test).  NUTRITION Drink plenty of fluids.  You may resume your normal diet.  Begin with a light meal and progress to your normal diet.  Avoid alcoholic beverages for 24 hours or as instructed by your caregiver.  MEDICATIONS You may resume your normal medications unless your caregiver tells you otherwise.  WHAT YOU CAN EXPECT TODAY You may experience abdominal discomfort such as a feeling of fullness or "gas" pains.  FOLLOW-UP Your doctor will discuss the results of your test with you.  SEEK IMMEDIATE MEDICAL ATTENTION IF ANY OF THE FOLLOWING OCCUR: Excessive nausea (feeling sick to your stomach) and/or vomiting.  Severe abdominal pain and distention (swelling).  Trouble swallowing.  Temperature over 101 F (37.8 C).  Rectal bleeding or vomiting of blood.   Your EGD revealed mild amount inflammation in your stomach.  I took biopsies of this to rule out infection with a bacteria called H. pylori.  Await pathology results, my  office will contact you. I also took biopsies of your esophagus to rule out a condition called eosinophilic esophagitis.  I am going to increase your pantoprazole to 40 mg twice daily.  I have sent this to your pharmacy.  We may need to consider further imaging pending biopsy results.  Follow-up with GI in 3 to 4 months office will notify you.    I hope you have a great rest of your week!  Hennie Duos. Marletta Lor, D.O. Gastroenterology and Hepatology [facility]

## 2021-05-08 NOTE — Interval H&P Note (Signed)
History and Physical Interval Note:  05/08/2021 9:39 AM  Christopher Frey  has presented today for surgery, with the diagnosis of epigastric pain, GERD.  The various methods of treatment have been discussed with the patient and family. After consideration of risks, benefits and other options for treatment, the patient has consented to  Procedure(s) with comments: ESOPHAGOGASTRODUODENOSCOPY (EGD) WITH PROPOFOL (N/A) - 3:00pm as a surgical intervention.  The patient's history has been reviewed, patient examined, no change in status, stable for surgery.  I have reviewed the patient's chart and labs.  Questions were answered to the patient's satisfaction.     Lanelle Bal

## 2021-05-08 NOTE — Op Note (Signed)
[facility] Patient Name: Christopher Frey Procedure Date: 05/08/2021 9:59 AM MRN: [id number] Date of Birth: 02-05-1991 Attending MD: Elon Alas. Abbey Chatters DO CSN: [id number] Age: 30 Admit Type: Outpatient Procedure:                Upper GI endoscopy Indications:              Epigastric abdominal pain, Heartburn Providers:                Elon Alas. Abbey Chatters, DO, Charlsie Quest. Theda Sers RN, RN,                            Nelma Rothman, Technician Referring MD:              Medicines:                See the Anesthesia note for documentation of the                            administered medications Complications:            No immediate complications. Estimated Blood Loss:     Estimated blood loss was minimal. Procedure:                Pre-Anesthesia Assessment:                           - The anesthesia plan was to use monitored                            anesthesia care (MAC).                           After obtaining informed consent, the endoscope was                            passed under direct vision. Throughout the                            procedure, the patient's blood pressure, pulse, and                            oxygen saturations were monitored continuously. The                            [id number]-H190 ([id number]) scope was introduced through the                            mouth, and advanced to the second part of duodenum.                            The upper GI endoscopy was accomplished without                            difficulty. The patient tolerated the procedure                            well. Scope  In: 10:06:45 AM Scope Out: 10:10:53 AM Total Procedure Duration: 0 hours 4 minutes 8 seconds  Findings:      Mucosal changes including ringed esophagus and longitudinal furrows were       found in the middle third of the esophagus. Esophageal findings were       graded using the Eosinophilic Esophagitis Endoscopic Reference Score       (EoE-EREFS) as: Edema Grade 0 Normal (distinct  vascular markings), Rings       Grade 1 Mild (subtle circumferential ridges seen on esophageal       distension), Exudates Grade 0 None (no white lesions seen), Furrows       Grade 1 Present (vertical lines with or without visible depth) and       Stricture none (no stricture found). Biopsies were taken with a cold       forceps for histology.      Localized mild inflammation characterized by erythema was found in the       gastric body. Biopsies were taken with a cold forceps for Helicobacter       pylori testing.      The duodenal bulb, first portion of the duodenum and second portion of       the duodenum were normal. Impression:               - Esophageal mucosal changes suspicious for                            eosinophilic esophagitis. Biopsied.                           - Gastritis. Biopsied.                           - Normal duodenal bulb, first portion of the                            duodenum and second portion of the duodenum. Moderate Sedation:      Per Anesthesia Care Recommendation:           - Patient has a contact number available for                            emergencies. The signs and symptoms of potential                            delayed complications were discussed with the                            patient. Return to normal activities tomorrow.                            Written discharge instructions were provided to the                            patient.                           - Resume previous diet.                           -  Continue present medications.                           - Await pathology results.                           - Use Protonix (pantoprazole) 40 mg PO BID for 12                            weeks.                           - No ibuprofen, naproxen, or other non-steroidal                            anti-inflammatory drugs.                           - Consider CT imaging of abdomen/pelvis Procedure Code(s):        --- Professional  ---                           [phone], Esophagogastroduodenoscopy, flexible,                            transoral; with biopsy, single or multiple Diagnosis Code(s):        --- Professional ---                           K22.8, Other specified diseases of esophagus                           K29.70, Gastritis, unspecified, without bleeding                           R10.13, Epigastric pain                           R12, Heartburn CPT copyright 2019 American Medical Association. All rights reserved. The codes documented in this report are preliminary and upon coder review may  be revised to meet current compliance requirements. Elon Alas. Abbey Chatters, DO [place] Abbey Chatters, DO 05/08/2021 10:16:43 AM This report has been signed electronically. Number of Addenda: 0

## 2021-05-08 NOTE — Anesthesia Preprocedure Evaluation (Addendum)
Anesthesia Evaluation  Patient identified by MRN, date of birth, ID band Patient awake    Reviewed: Allergy & Precautions, NPO status , Patient's Chart, lab work & pertinent test results  Airway Mallampati: I  TM Distance: >3 FB Neck ROM: Full    Dental  (+) Dental Advisory Given, Teeth Intact   Pulmonary neg pulmonary ROS,    Pulmonary exam normal breath sounds clear to auscultation       Cardiovascular Exercise Tolerance: Good Normal cardiovascular exam Rhythm:Regular Rate:Normal     Neuro/Psych negative neurological ROS  negative psych ROS   GI/Hepatic Neg liver ROS, GERD  Medicated,  Endo/Other  negative endocrine ROS  Renal/GU negative Renal ROS     Musculoskeletal negative musculoskeletal ROS (+)   Abdominal   Peds  Hematology negative hematology ROS (+)   Anesthesia Other Findings   Reproductive/Obstetrics negative OB ROS                            Anesthesia Physical Anesthesia Plan  ASA: 1  Anesthesia Plan: General   Post-op Pain Management: Minimal or no pain anticipated   Induction:   PONV Risk Score and Plan: TIVA  Airway Management Planned: Nasal Cannula and Natural Airway  Additional Equipment:   Intra-op Plan:   Post-operative Plan:   Informed Consent: I have reviewed the patients History and Physical, chart, labs and discussed the procedure including the risks, benefits and alternatives for the proposed anesthesia with the patient or authorized representative who has indicated his/her understanding and acceptance.     Dental advisory given  Plan Discussed with: CRNA and Surgeon  Anesthesia Plan Comments:         Anesthesia Quick Evaluation

## 2021-05-08 NOTE — Telephone Encounter (Signed)
Noted  

## 2021-05-08 NOTE — Telephone Encounter (Signed)
Pt will need a follow up appt with Dr. Marletta Lor in 3 to 4 mths per Endo. Pt prefers an afternoon appointment.

## 2021-05-08 NOTE — Transfer of Care (Signed)
Immediate Anesthesia Transfer of Care Note  Patient: Christopher Frey  Procedure(s) Performed: ESOPHAGOGASTRODUODENOSCOPY (EGD) WITH PROPOFOL BIOPSY  Patient Location: Endoscopy Unit  Anesthesia Type:General  Level of Consciousness: awake  Airway & Oxygen Therapy: Patient Spontanous Breathing  Post-op Assessment: Report given to RN and Post -op Vital signs reviewed and stable  Post vital signs: Reviewed and stable  Last Vitals:  Vitals Value Taken Time  BP    Temp    Pulse    Resp    SpO2      Last Pain:  Vitals:   05/08/21 1004  TempSrc:   PainSc: 4       Patients Stated Pain Goal: 8 (05/08/21 0917)  Complications: No notable events documented.

## 2021-05-09 ENCOUNTER — Telehealth: Payer: Self-pay | Admitting: Internal Medicine

## 2021-05-09 LAB — SURGICAL PATHOLOGY

## 2021-05-09 NOTE — Telephone Encounter (Signed)
FYI: Returned the pt's call and was advised that he is still having abd pain. I advised the pt that his procedure would not stop his abdomen from hurting that it will alert the doctors to the issue (if there is one). The pt states he can see report from his end and I explained to him that the dr has not sent it to me with instructions. Once he sends it to me I will call him. But I also advised him to follow the instructions he was given at the time of release and the pt just hung up.

## 2021-05-09 NOTE — Telephone Encounter (Signed)
Patient had procedure yesterday and is still having pain in his abdomen, wants to know if this is normal after a procedure

## 2021-05-11 ENCOUNTER — Encounter (HOSPITAL_COMMUNITY): Payer: Self-pay | Admitting: Internal Medicine

## 2021-05-17 ENCOUNTER — Telehealth: Payer: Self-pay | Admitting: Internal Medicine

## 2021-05-17 NOTE — Telephone Encounter (Signed)
Pt had procedure on 12/6 and was calling for his results. [phone]

## 2021-05-17 NOTE — Telephone Encounter (Signed)
Returned the pt's call and advised the pt that his report is done but has not been reported to me. Advised I would call him when it is read. Pt expresses understanding.

## 2021-05-21 ENCOUNTER — Telehealth: Payer: Self-pay

## 2021-05-21 DIAGNOSIS — R109 Unspecified abdominal pain: Secondary | ICD-10-CM

## 2021-05-21 NOTE — Telephone Encounter (Signed)
Pt called wanting to know the results from his recent procedure. Pt is also stating that he is still having abdominal pain. Pt has been taking pantoprazole 40 mg twice daily as directed and has also been watching what he eats. I informed pt that you would be back on Wednesday and would review his results and address his issues at that time. Pt verbalized understanding and was ok with waiting upon your return.

## 2021-05-23 NOTE — Telephone Encounter (Signed)
I called and spoke with patient.  His reflux is improved on pantoprazole twice daily though he continues to have abdominal pain.  Also notes diarrhea as well.  Can we set him up for CT abdomen pelvis with contrast?  Thank you

## 2021-05-24 ENCOUNTER — Ambulatory Visit (HOSPITAL_COMMUNITY)
Admission: RE | Admit: 2021-05-24 | Discharge: 2021-05-24 | Disposition: A | Discharge status: Home / Self Care | Payer: BC Managed Care – PPO | Source: Ambulatory Visit | Attending: Physician Assistant | Admitting: Physician Assistant

## 2021-05-24 ENCOUNTER — Other Ambulatory Visit: Payer: Self-pay | Admitting: Physician Assistant

## 2021-05-24 ENCOUNTER — Other Ambulatory Visit: Payer: Self-pay

## 2021-05-24 DIAGNOSIS — R1032 Left lower quadrant pain: Secondary | ICD-10-CM

## 2021-05-24 DIAGNOSIS — N50812 Left testicular pain: Secondary | ICD-10-CM | POA: Diagnosis not present

## 2021-05-24 DIAGNOSIS — N5089 Other specified disorders of the male genital organs: Secondary | ICD-10-CM | POA: Diagnosis not present

## 2021-05-24 DIAGNOSIS — Z681 Body mass index (BMI) 19 or less, adult: Secondary | ICD-10-CM | POA: Diagnosis not present

## 2021-05-24 NOTE — Telephone Encounter (Signed)
PA for CT abd/pelvis w/contrast submitted via [employer] website. Order ID: [id number], valid 05/24/21-06/22/21.  CT abd/pelvis w/contrast scheduled for 06/01/21 at 9:00am, arrive at 8:45am. Pick up contrast prior to appt. NPO 4 hours prior to test. CT scheduled at [facility] d/t [facility] had no available appts until end of January. Called and informed pt of CT appt. Letter mailed and sent to MyChart.

## 2021-05-24 NOTE — Addendum Note (Signed)
Addended by: Sandria Senter C on: 05/24/2021 08:00 AM   Modules accepted: Orders

## 2021-06-01 ENCOUNTER — Other Ambulatory Visit: Payer: Self-pay

## 2021-06-01 ENCOUNTER — Ambulatory Visit (HOSPITAL_BASED_OUTPATIENT_CLINIC_OR_DEPARTMENT_OTHER)
Admission: RE | Admit: 2021-06-01 | Discharge: 2021-06-01 | Disposition: A | Discharge status: Home / Self Care | Payer: BC Managed Care – PPO | Source: Ambulatory Visit | Attending: Internal Medicine | Admitting: Internal Medicine

## 2021-06-01 DIAGNOSIS — R109 Unspecified abdominal pain: Secondary | ICD-10-CM | POA: Insufficient documentation

## 2021-06-01 MED ORDER — IOHEXOL 300 MG/ML  SOLN
100.0000 mL | Freq: Once | INTRAMUSCULAR | Status: AC | PRN
  Administered 2021-06-01: 09:00:00 80 mL via INTRAVENOUS

## 2021-09-10 ENCOUNTER — Encounter: Payer: Self-pay | Admitting: Internal Medicine

## 2021-10-06 ENCOUNTER — Ambulatory Visit
Admission: EM | Admit: 2021-10-06 | Discharge: 2021-10-06 | Disposition: A | Discharge status: Home / Self Care | Payer: BC Managed Care – PPO | Attending: Family Medicine | Admitting: Family Medicine

## 2021-10-06 ENCOUNTER — Ambulatory Visit (INDEPENDENT_AMBULATORY_CARE_PROVIDER_SITE_OTHER): Payer: BC Managed Care – PPO

## 2021-10-06 ENCOUNTER — Ambulatory Visit: Payer: BC Managed Care – PPO

## 2021-10-06 DIAGNOSIS — R062 Wheezing: Secondary | ICD-10-CM | POA: Diagnosis not present

## 2021-10-06 DIAGNOSIS — R509 Fever, unspecified: Secondary | ICD-10-CM | POA: Diagnosis not present

## 2021-10-06 DIAGNOSIS — J4521 Mild intermittent asthma with (acute) exacerbation: Secondary | ICD-10-CM

## 2021-10-06 DIAGNOSIS — R059 Cough, unspecified: Secondary | ICD-10-CM

## 2021-10-06 DIAGNOSIS — J069 Acute upper respiratory infection, unspecified: Secondary | ICD-10-CM | POA: Diagnosis not present

## 2021-10-06 MED ORDER — ALBUTEROL SULFATE HFA 108 (90 BASE) MCG/ACT IN AERS: 1.0000 | INHALATION_SPRAY | Freq: Four times a day (QID) | RESPIRATORY_TRACT | 1 refills | Status: DC | PRN

## 2021-10-06 MED ORDER — PREDNISONE 20 MG PO TABS: 40.0000 mg | ORAL_TABLET | Freq: Every day | ORAL | 0 refills | Status: DC

## 2021-10-06 MED ORDER — HYDROCODONE BIT-HOMATROP MBR 5-1.5 MG/5ML PO SOLN: 5.0000 mL | Freq: Four times a day (QID) | ORAL | 0 refills | Status: DC | PRN

## 2021-10-06 NOTE — ED Triage Notes (Signed)
Pt presents with c/o cough that developed after having nasal congestion, pt describes pain with inspiration, concerned for pneumonia. He used his sons nebulizer last night  ?

## 2021-10-06 NOTE — Discharge Instructions (Signed)
Be aware, your cough medication may cause drowsiness. Please do not drive, operate heavy machinery or make important decisions while on this medication, it can cloud your judgement.  

## 2021-10-08 NOTE — ED Provider Notes (Signed)
?[facility] ? ? ?[id number] ?10/06/21 Arrival Time: 0807 ? ?ASSESSMENT & PLAN: ? ?1. Viral URI with cough   ?2. Mild intermittent asthma with acute exacerbation   ?3. Wheezing   ? ?I have personally viewed the imaging studies ordered this visit. ?No acute changes on CXR. ? ?Discussed typical duration of viral illnesses. ?OTC symptom care as needed. ?No resp distress. ? ?Discharge Medication List as of 10/06/2021  9:07 AM  ?  ? ?START taking these medications  ? Details  ?albuterol (VENTOLIN HFA) 108 (90 Base) MCG/ACT inhaler Inhale 1-2 puffs into the lungs every 6 (six) hours as needed for wheezing or shortness of breath., Starting Sat 10/06/2021, Normal  ?  ?HYDROcodone bit-homatropine (HYCODAN) 5-1.5 MG/5ML syrup Take 5 mLs by mouth every 6 (six) hours as needed for cough., Starting Sat 10/06/2021, Normal  ?  ?predniSONE (DELTASONE) 20 MG tablet Take 2 tablets (40 mg total) by mouth daily., Starting Sat 10/06/2021, Normal  ?  ?  ? ? ? ?Discharge Instructions   ? ?  ?Be aware, your cough medication may cause drowsiness. Please do not drive, operate heavy machinery or make important decisions while on this medication, it can cloud your judgement. ? ? ? ? ? ? Follow-up Information   ? ? Avis Epley, PA-C.   ?Specialty: Family Medicine ?Why: If worsening or failing to improve as anticipated. ?Contact information: ?[address] ?[place] [address] ?[phone] ? ? ?  ?  ? ?  ?  ? ?  ? ? ?Reviewed expectations re: course of current medical issues. Questions answered. ?Outlined signs and symptoms indicating need for more acute intervention. ?Understanding verbalized. ?After Visit Summary given. ? ? ?SUBJECTIVE: ?History from: Patient. ?Christopher Frey is a 31 y.o. male. Reports: nasal congestion and cough; over past week. Subj fever. Ques wheezing at times; using son's albuterol neb; does help. No current SOB/CP reported. Denies: sore throat and headache. Normal PO intake without  n/v/d. ? ?OBJECTIVE: ? ?Vitals:  ? 10/06/21 0820  ?BP: 124/86  ?Pulse: 91  ?Resp: 20  ?Temp: 97.9 ?F (36.6 ?C)  ?SpO2: 98%  ?  ?General appearance: alert; no distress ?Eyes: PERRLA; EOMI; conjunctiva normal ?HENT: [place]; AT; with nasal congestion ?Neck: supple  ?Lungs: speaks full sentences without difficulty; unlabored; bilat exp wheezing ?Extremities: no edema ?Skin: warm and dry ?Neurologic: normal gait ?Psychological: alert and cooperative; normal mood and affect ? ? ?Imaging: ?DG Chest 2 View ? ?Result Date: 10/06/2021 ?CLINICAL DATA:  Fever and cough. EXAM: CHEST - 2 VIEW COMPARISON:  04/23/2021 FINDINGS: Lungs are hyperexpanded without airspace consolidation or effusion. Cardiomediastinal silhouette and remainder of the exam is unchanged. IMPRESSION: No acute cardiopulmonary disease. Electronically Signed   By: Elberta Fortis M.D.   On: 10/06/2021 09:01  ? ? ?No Known Allergies ? ?Past Medical History:  ?Diagnosis Date  ? Acid reflux   ? ?Social History  ? ?Socioeconomic History  ? Marital status: Married  ?  Spouse name: Not on file  ? Number of children: Not on file  ? Years of education: Not on file  ? Highest education level: Not on file  ?Occupational History  ? Not on file  ?Tobacco Use  ? Smoking status: Never  ? Smokeless tobacco: Current  ?  Types: Chew  ?Vaping Use  ? Vaping Use: Never used  ?Substance and Sexual Activity  ? Alcohol use: Yes  ?  Alcohol/week: 2.0 - 3.0 standard drinks  ?  Types: 2 - 3  Cans of beer per week  ? Drug use: No  ? Sexual activity: Not on file  ?Other Topics Concern  ? Not on file  ?Social History Narrative  ? Not on file  ? ?Social Determinants of Health  ? ?Financial Resource Strain: Not on file  ?Food Insecurity: Not on file  ?Transportation Needs: Not on file  ?Physical Activity: Not on file  ?Stress: Not on file  ?Social Connections: Not on file  ?Intimate Partner Violence: Not on file  ? ?Family History  ?Problem Relation Age of Onset  ? Allergic rhinitis Father   ?  Allergic rhinitis Brother   ? Angioedema Neg Hx   ? Asthma Neg Hx   ? Eczema Neg Hx   ? Immunodeficiency Neg Hx   ? Urticaria Neg Hx   ? ?Past Surgical History:  ?Procedure Laterality Date  ? BIOPSY  05/08/2021  ? Procedure: BIOPSY;  Surgeon: Lanelle Bal, DO;  Location: [facility];  Service: Endoscopy;;  ? ESOPHAGOGASTRODUODENOSCOPY (EGD) WITH PROPOFOL N/A 05/08/2021  ? Procedure: ESOPHAGOGASTRODUODENOSCOPY (EGD) WITH PROPOFOL;  Surgeon: Lanelle Bal, DO;  Location: [facility];  Service: Endoscopy;  Laterality: N/A;  3:00pm  ? SHOULDER SURGERY    ? ?  ?Mardella Layman, MD ?10/08/21 1019 ? ?

## 2021-10-23 ENCOUNTER — Telehealth: Payer: Self-pay | Admitting: Emergency Medicine

## 2021-10-23 NOTE — Telephone Encounter (Signed)
Pt called and reported to patient access that was not feeling any better. Attempted to call pt x2 and no answer. Voicemail left at second attempt stating pt would need to be re-seen and provided [facility] call back number if had any additional questions.

## 2022-04-01 ENCOUNTER — Ambulatory Visit: Payer: Self-pay

## 2022-04-02 ENCOUNTER — Ambulatory Visit
Admission: RE | Admit: 2022-04-02 | Discharge: 2022-04-02 | Disposition: A | Discharge status: Home / Self Care | Payer: 59 | Source: Ambulatory Visit | Attending: Nurse Practitioner | Admitting: Nurse Practitioner

## 2022-04-02 ENCOUNTER — Other Ambulatory Visit: Payer: Self-pay

## 2022-04-02 VITALS — BP 136/77 | HR 102 | Temp 99.3°F | Resp 20

## 2022-04-02 DIAGNOSIS — Z8709 Personal history of other diseases of the respiratory system: Secondary | ICD-10-CM | POA: Diagnosis not present

## 2022-04-02 DIAGNOSIS — J069 Acute upper respiratory infection, unspecified: Secondary | ICD-10-CM

## 2022-04-02 DIAGNOSIS — R0789 Other chest pain: Secondary | ICD-10-CM

## 2022-04-02 DIAGNOSIS — R051 Acute cough: Secondary | ICD-10-CM

## 2022-04-02 MED ORDER — ALBUTEROL SULFATE (2.5 MG/3ML) 0.083% IN NEBU: 2.5000 mg | INHALATION_SOLUTION | Freq: Four times a day (QID) | RESPIRATORY_TRACT | 0 refills | Status: AC | PRN

## 2022-04-02 MED ORDER — ALBUTEROL SULFATE (2.5 MG/3ML) 0.083% IN NEBU
2.5000 mg | INHALATION_SOLUTION | Freq: Once | RESPIRATORY_TRACT | Status: AC
  Administered 2022-04-02: 2.5 mg via RESPIRATORY_TRACT

## 2022-04-02 MED ORDER — BENZONATATE 100 MG PO CAPS: 100.0000 mg | ORAL_CAPSULE | Freq: Three times a day (TID) | ORAL | 0 refills | Status: DC | PRN

## 2022-04-02 MED ORDER — PREDNISONE 20 MG PO TABS: 40.0000 mg | ORAL_TABLET | Freq: Every day | ORAL | 0 refills | Status: DC

## 2022-04-02 NOTE — ED Triage Notes (Signed)
Pt reports cough, head/nasal congestion, and reports "hard to breath at times." Reports history of asthma and states inhaler hasn't seem to been working.   Reports son has RSV. No other known exposures.

## 2022-04-02 NOTE — ED Provider Notes (Signed)
[facility]    CSN: [id number] Arrival date & time: 04/02/22  1258      History   Chief Complaint Chief Complaint  Patient presents with   Sore Throat    Body aches, severe sinus pressure headaches. Throat is sore when i swallow. - Entered by patient    HPI Christopher Frey is a 31 y.o. male.   Patient presents with 4 days of body aches, congested cough, increased shortness of breath, chest tightness and chest congestion, runny nose, postnasal drainage, sore throat, headache, decreased appetite, and fatigue.  No wheezing, chest pain, nasal congestion, sneezing, ear pain, abdominal pain, nausea/vomiting, diarrhea, loss of taste or smell, or new rash.  Reports his son currently has RSV and they have been giving him breathing treatments every 4-6 hours as needed for his wheezing.  Patient reports history of asthma, however has not had albuterol inhaler since last time he was sick.  Has taken Sudafed, Tylenol, and tried increasing fluid intake for symptoms with minimal relief.      Past Medical History:  Diagnosis Date   Acid reflux     Patient Active Problem List   Diagnosis Date Noted   Difficulty in walking(719.7) 06/05/2012   Stiffness of joints, not elsewhere classified, multiple sites 06/05/2012    Past Surgical History:  Procedure Laterality Date   BIOPSY  05/08/2021   Procedure: BIOPSY;  Surgeon: Eloise Harman, DO;  [facility]: [facility];  Service: Endoscopy;;   ESOPHAGOGASTRODUODENOSCOPY (EGD) WITH PROPOFOL N/A 05/08/2021   Procedure: ESOPHAGOGASTRODUODENOSCOPY (EGD) WITH PROPOFOL;  Surgeon: Eloise Harman, DO;  Location: [facility];  Service: Endoscopy;  Laterality: N/A;  3:00pm   SHOULDER SURGERY         Home Medications    Prior to Admission medications   Medication Sig Start Date End Date Taking? Authorizing Provider  albuterol (PROVENTIL) (2.5 MG/3ML) 0.083% nebulizer solution Take 3 mLs (2.5 mg total) by nebulization every 6 (six)  hours as needed for wheezing or shortness of breath. 04/02/22  Yes Eulogio Bear, NP  benzonatate (TESSALON) 100 MG capsule Take 1 capsule (100 mg total) by mouth 3 (three) times daily as needed for cough. Do not take with alcohol or while driving or operating heavy machinery.  May cause drowsiness. 04/02/22  Yes Eulogio Bear, NP  pantoprazole (PROTONIX) 40 MG tablet Take 1 tablet (40 mg total) by mouth 2 (two) times daily before a meal. 05/08/21 11/04/21  Eloise Harman, DO  sucralfate (CARAFATE) 1 g tablet Take 1 tablet (1 g total) by mouth 4 (four) times daily -  with meals and at bedtime. 04/23/21   Varney Biles, MD    Family History Family History  Problem Relation Age of Onset   Allergic rhinitis Father    Allergic rhinitis Brother    Angioedema Neg Hx    Asthma Neg Hx    Eczema Neg Hx    Immunodeficiency Neg Hx    Urticaria Neg Hx     Social History Social History   Tobacco Use   Smoking status: Never   Smokeless tobacco: Current    Types: Chew  Vaping Use   Vaping Use: Never used  Substance Use Topics   Alcohol use: Yes    Alcohol/week: 2.0 - 3.0 standard drinks of alcohol    Types: 2 - 3 Cans of beer per week   Drug use: No     Allergies   Patient has no known allergies.  Review of Systems Review of Systems Per HPI  Physical Exam Triage Vital Signs ED Triage Vitals [04/02/22 1314]  Enc Vitals Group     BP 136/77     Pulse Rate (!) 102     Resp 20     Temp 99.3 F (37.4 C)     Temp Source Oral     SpO2 96 %     Weight      Height      Head Circumference      Peak Flow      Pain Score 5     Pain Loc      Pain Edu?      Excl. in [place]?    No data found.  Updated Vital Signs BP 136/77 (BP Location: Right Arm)   Pulse (!) 102   Temp 99.3 F (37.4 C) (Oral)   Resp 20   SpO2 96%   Visual Acuity Right Eye Distance:   Left Eye Distance:   Bilateral Distance:    Right Eye Near:   Left Eye Near:    Bilateral Near:      Physical Exam Vitals and nursing note reviewed.  Constitutional:      General: He is not in acute distress.    Appearance: Normal appearance. He is not ill-appearing or toxic-appearing.  HENT:     Head: Normocephalic and atraumatic.     Right Ear: Ear canal and external ear normal. No drainage, swelling or tenderness. No middle ear effusion. Tympanic membrane is scarred. Tympanic membrane is not erythematous.     Left Ear: Ear canal and external ear normal. No drainage, swelling or tenderness.  No middle ear effusion. Tympanic membrane is scarred. Tympanic membrane is not erythematous.     Nose: Congestion and rhinorrhea present.     Mouth/Throat:     Mouth: Mucous membranes are moist.     Pharynx: Oropharynx is clear. Posterior oropharyngeal erythema present. No oropharyngeal exudate.     Tonsils: No tonsillar exudate. 2+ on the right. 2+ on the left.  Eyes:     General: No scleral icterus.    Extraocular Movements: Extraocular movements intact.  Cardiovascular:     Rate and Rhythm: Normal rate and regular rhythm.  Pulmonary:     Effort: Pulmonary effort is normal. No respiratory distress.     Breath sounds: Normal breath sounds. No wheezing, rhonchi or rales.     Comments: Patient speaking in full sentences without acute distress.  Occasional dry sounding cough during examination. Chest:     Chest wall: Tenderness present.  Abdominal:     General: Abdomen is flat. Bowel sounds are normal. There is no distension.     Palpations: Abdomen is soft.  Musculoskeletal:     Cervical back: Normal range of motion and neck supple.  Lymphadenopathy:     Cervical: No cervical adenopathy.  Skin:    General: Skin is warm and dry.     Coloration: Skin is not jaundiced or pale.     Findings: No erythema or rash.  Neurological:     Mental Status: He is alert and oriented to person, place, and time.     Motor: No weakness.  Psychiatric:        Behavior: Behavior is cooperative.       UC Treatments / Results  Labs (all labs ordered are listed, but only abnormal results are displayed) Labs Reviewed - No data to display  EKG   Radiology No results found.  Procedures Procedures (including critical care time)  Medications Ordered in UC Medications  albuterol (PROVENTIL) (2.5 MG/3ML) 0.083% nebulizer solution 2.5 mg (2.5 mg Nebulization Given 04/02/22 1339)    Initial Impression / Assessment and Plan / UC Course  I have reviewed the triage vital signs and the nursing notes.  Pertinent labs & imaging results that were available during my care of the patient were reviewed by me and considered in my medical decision making (see chart for details).   Patient is well-appearing, normotensive, afebrile, not tachycardic, not tachypneic, oxygenating well on room air.     Viral URI with cough Acute cough History of asthma Chest tightness Suspect symptoms are secondary to acute viral illness Patient declines viral testing today Overall, examination is reassuring Breathing treatment given for chest tightness which reportedly helped open up chest per patient report Prescription given for albuterol nebulizer-continue every 4-6 hours as needed for chest tightness Start cough suppressant Supportive care discussed ER and return precautions discussed Note given for work  The patient was given the opportunity to ask questions.  All questions answered to their satisfaction.  The patient is in agreement to this plan.    Final Clinical Impressions(s) / UC Diagnoses   Final diagnoses:  Acute cough  History of asthma  Chest tightness  Viral URI with cough     Discharge Instructions      You have a viral upper respiratory infection.  Symptoms should improve over the next week to 10 days.  If you develop chest pain or shortness of breath, go to the emergency room.  Some things that can make you feel better are: - Increased rest - Increasing fluid with  water/sugar free electrolytes - Acetaminophen and ibuprofen as needed for fever/pain - Salt water gargling, chloraseptic spray and throat lozenges - OTC guaifenesin (Mucinex) 600 mg twice daily - Saline sinus flushes or a neti pot - Humidifying the air -Tessalon Perles during the day as needed for dry cough     ED Prescriptions     Medication Sig Dispense Auth. Provider   predniSONE (DELTASONE) 20 MG tablet  (Status: Discontinued) Take 2 tablets (40 mg total) by mouth daily. 14 tablet Noemi Chapel A, NP   albuterol (PROVENTIL) (2.5 MG/3ML) 0.083% nebulizer solution Take 3 mLs (2.5 mg total) by nebulization every 6 (six) hours as needed for wheezing or shortness of breath. 75 mL Noemi Chapel A, NP   benzonatate (TESSALON) 100 MG capsule Take 1 capsule (100 mg total) by mouth 3 (three) times daily as needed for cough. Do not take with alcohol or while driving or operating heavy machinery.  May cause drowsiness. 21 capsule Eulogio Bear, NP      PDMP not reviewed this encounter.   Eulogio Bear, NP 04/02/22 [phone]

## 2022-04-02 NOTE — Discharge Instructions (Addendum)
You have a viral upper respiratory infection.  Symptoms should improve over the next week to 10 days.  If you develop chest pain or shortness of breath, go to the emergency room.  Some things that can make you feel better are: - Increased rest - Increasing fluid with water/sugar free electrolytes - Acetaminophen and ibuprofen as needed for fever/pain - Salt water gargling, chloraseptic spray and throat lozenges - OTC guaifenesin (Mucinex) 600 mg twice daily - Saline sinus flushes or a neti pot - Humidifying the air -Tessalon Perles during the day as needed for dry cough

## 2022-04-10 ENCOUNTER — Ambulatory Visit
Admission: EM | Admit: 2022-04-10 | Discharge: 2022-04-10 | Disposition: A | Discharge status: Home / Self Care | Payer: 59 | Attending: Family Medicine | Admitting: Family Medicine

## 2022-04-10 DIAGNOSIS — J4521 Mild intermittent asthma with (acute) exacerbation: Secondary | ICD-10-CM | POA: Diagnosis not present

## 2022-04-10 DIAGNOSIS — J3089 Other allergic rhinitis: Secondary | ICD-10-CM

## 2022-04-10 MED ORDER — METHYLPREDNISOLONE SODIUM SUCC 125 MG IJ SOLR
80.0000 mg | Freq: Once | INTRAMUSCULAR | Status: AC
  Administered 2022-04-10: 80 mg via INTRAMUSCULAR

## 2022-04-10 MED ORDER — PREDNISONE 20 MG PO TABS: 40.0000 mg | ORAL_TABLET | Freq: Every day | ORAL | 0 refills | Status: DC

## 2022-04-10 MED ORDER — FLUTICASONE PROPIONATE 50 MCG/ACT NA SUSP: 1.0000 | Freq: Two times a day (BID) | NASAL | 2 refills | Status: DC

## 2022-04-10 MED ORDER — ALBUTEROL SULFATE HFA 108 (90 BASE) MCG/ACT IN AERS: 2.0000 | INHALATION_SPRAY | RESPIRATORY_TRACT | 0 refills | Status: AC | PRN

## 2022-04-10 MED ORDER — CETIRIZINE HCL 10 MG PO TABS: 10.0000 mg | ORAL_TABLET | Freq: Every day | ORAL | 2 refills | Status: DC

## 2022-04-10 NOTE — ED Provider Notes (Signed)
[facility]    CSN: [id number] Arrival date & time: 04/10/22  0803      History   Chief Complaint No chief complaint on file.   HPI Christopher Frey is a 31 y.o. male.   Presenting today with over a week of chest tightness, shortness of breath following cold-like illness.  Was seen last week at this urgent care and given prednisone, albuterol she states helped very mildly but he is still experiencing symptoms.  Has been taking Sudafed, cough syrup and completed the prescriptions from last week.  History of asthma, seasonal allergies not currently on any allergy regimen.  Denies fever, chills, chest pain, abdominal pain, nausea vomiting or diarrhea.    Past Medical History:  Diagnosis Date   Acid reflux     Patient Active Problem List   Diagnosis Date Noted   Difficulty in walking(719.7) 06/05/2012   Stiffness of joints, not elsewhere classified, multiple sites 06/05/2012    Past Surgical History:  Procedure Laterality Date   BIOPSY  05/08/2021   Procedure: BIOPSY;  Surgeon: Eloise Harman, DO;  [facility];  Service: Endoscopy;;   ESOPHAGOGASTRODUODENOSCOPY (EGD) WITH PROPOFOL N/A 05/08/2021   Procedure: ESOPHAGOGASTRODUODENOSCOPY (EGD) WITH PROPOFOL;  Surgeon: Eloise Harman, DO;  [facility];  Service: Endoscopy;  Laterality: N/A;  3:00pm   SHOULDER SURGERY         Home Medications    Prior to Admission medications   Medication Sig Start Date End Date Taking? Authorizing Provider  albuterol (VENTOLIN HFA) 108 (90 Base) MCG/ACT inhaler Inhale 2 puffs into the lungs every 4 (four) hours as needed for wheezing or shortness of breath. 04/10/22  Yes Christopher American, PA-C  cetirizine (ZYRTEC ALLERGY) 10 MG tablet Take 1 tablet (10 mg total) by mouth daily. 04/10/22  Yes Christopher American, PA-C  fluticasone [facility]) 50 MCG/ACT nasal spray Place 1 spray into both nostrils 2 (two) times daily. 04/10/22  Yes Christopher American, PA-C  predniSONE (DELTASONE) 20 MG tablet Take 2 tablets (40 mg total) by mouth daily with breakfast. 04/10/22  Yes Christopher American, PA-C  albuterol (PROVENTIL) (2.5 MG/3ML) 0.083% nebulizer solution Take 3 mLs (2.5 mg total) by nebulization every 6 (six) hours as needed for wheezing or shortness of breath. 04/02/22   Eulogio Bear, NP  benzonatate (TESSALON) 100 MG capsule Take 1 capsule (100 mg total) by mouth 3 (three) times daily as needed for cough. Do not take with alcohol or while driving or operating heavy machinery.  May cause drowsiness. 04/02/22   Eulogio Bear, NP  pantoprazole (PROTONIX) 40 MG tablet Take 1 tablet (40 mg total) by mouth 2 (two) times daily before a meal. 05/08/21 11/04/21  Carver, Elon Alas, DO  sucralfate (CARAFATE) 1 g tablet Take 1 tablet (1 g total) by mouth 4 (four) times daily -  with meals and at bedtime. 04/23/21   Varney Biles, MD    Family History Family History  Problem Relation Age of Onset   Allergic rhinitis Father    Allergic rhinitis Brother    Angioedema Neg Hx    Asthma Neg Hx    Eczema Neg Hx    Immunodeficiency Neg Hx    Urticaria Neg Hx     Social History Social History   Tobacco Use   Smoking status: Never   Smokeless tobacco: Current    Types: Chew  Vaping Use   Vaping Use: Never used  Substance Use  Topics   Alcohol use: Yes    Alcohol/week: 2.0 - 3.0 standard drinks of alcohol    Types: 2 - 3 Cans of beer per week   Drug use: No     Allergies   Patient has no known allergies.   Review of Systems Review of Systems Per HPI  Physical Exam Triage Vital Signs ED Triage Vitals  Enc Vitals Group     BP 04/10/22 0812 118/74     Pulse Rate 04/10/22 0812 86     Resp 04/10/22 0812 20     Temp 04/10/22 0812 97.9 F (36.6 C)     Temp Source 04/10/22 0812 Oral     SpO2 04/10/22 0812 98 %     Weight --      Height --      Head Circumference --      Peak Flow --      Pain Score 04/10/22 0813  0     Pain Loc --      Pain Edu? --      Excl. in [place]? --    No data found.  Updated Vital Signs BP 118/74 (BP Location: Right Arm)   Pulse 86   Temp 97.9 F (36.6 C) (Oral)   Resp 20   SpO2 98%   Visual Acuity Right Eye Distance:   Left Eye Distance:   Bilateral Distance:    Right Eye Near:   Left Eye Near:    Bilateral Near:     Physical Exam Vitals and nursing note reviewed.  Constitutional:      Appearance: He is well-developed.  HENT:     Head: Atraumatic.     Right Ear: External ear normal.     Left Ear: External ear normal.     Nose: Rhinorrhea present.     Mouth/Throat:     Pharynx: Posterior oropharyngeal erythema present. No oropharyngeal exudate.  Eyes:     Conjunctiva/sclera: Conjunctivae normal.     Pupils: Pupils are equal, round, and reactive to light.  Cardiovascular:     Rate and Rhythm: Normal rate and regular rhythm.  Pulmonary:     Effort: Pulmonary effort is normal. No respiratory distress.     Breath sounds: Wheezing present. No rales.  Musculoskeletal:        General: Normal range of motion.     Cervical back: Normal range of motion and neck supple.  Lymphadenopathy:     Cervical: No cervical adenopathy.  Skin:    General: Skin is warm and dry.  Neurological:     Mental Status: He is alert and oriented to person, place, and time.  Psychiatric:        Behavior: Behavior normal.      UC Treatments / Results  Labs (all labs ordered are listed, but only abnormal results are displayed) Labs Reviewed - No data to display  EKG   Radiology No results found.  Procedures Procedures (including critical care time)  Medications Ordered in UC Medications  methylPREDNISolone sodium succinate (SOLU-MEDROL) 125 mg/2 mL injection 80 mg (80 mg Intramuscular Given 04/10/22 0832)    Initial Impression / Assessment and Plan / UC Course  I have reviewed the triage vital signs and the nursing notes.  Pertinent labs & imaging results that  were available during my care of the patient were reviewed by me and considered in my medical decision making (see chart for details).     Continued asthma exacerbation, likely lingering due to lack of  control of allergy symptoms.  We will restart allergy regimen with Zyrtec and Flonase daily, IM Solu-Medrol given in clinic and oral prednisone sent to pharmacy in case symptoms not resolving with just the IM dose.  Discussed supportive over-the-counter medications and home care.  Return for any worsening symptoms.  Final Clinical Impressions(s) / UC Diagnoses   Final diagnoses:  Seasonal allergic rhinitis due to other allergic trigger  Mild intermittent asthma with acute exacerbation   Discharge Instructions   None    ED Prescriptions     Medication Sig Dispense Auth. Provider   predniSONE (DELTASONE) 20 MG tablet Take 2 tablets (40 mg total) by mouth daily with breakfast. 10 tablet Particia Nearing, PA-C   albuterol (VENTOLIN HFA) 108 (90 Base) MCG/ACT inhaler Inhale 2 puffs into the lungs every 4 (four) hours as needed for wheezing or shortness of breath. 18 g Particia Nearing, [place]   cetirizine (ZYRTEC ALLERGY) 10 MG tablet Take 1 tablet (10 mg total) by mouth daily. 30 tablet Particia Nearing, PA-C   fluticasone [facility]) 50 MCG/ACT nasal spray Place 1 spray into both nostrils 2 (two) times daily. 16 g Particia Nearing, [place]      PDMP not reviewed this encounter.   Roosvelt Maser [place], [place] 04/11/22 [phone]

## 2022-04-10 NOTE — ED Triage Notes (Signed)
Pt reports came last week to get abuterol but has no relief since last Tuesday. He is experiencing shortness of breath. Reports chest is tight.   Reports tessalon pearls did not work. Took musinex with honey and nothings working.

## 2022-11-05 ENCOUNTER — Other Ambulatory Visit: Payer: Self-pay | Admitting: Family Medicine

## 2022-11-05 NOTE — Telephone Encounter (Signed)
Requested Prescriptions  Refused Prescriptions Disp Refills   cetirizine (ZYRTEC) 10 MG tablet [Pharmacy Med Name: CETIRIZINE 10MG  TABLETS] 30 tablet 2    Sig: TAKE 1 TABLET(10 MG) BY MOUTH DAILY     There is no refill protocol information for this order

## 2023-02-06 ENCOUNTER — Other Ambulatory Visit (HOSPITAL_COMMUNITY): Payer: Self-pay | Admitting: Family Medicine

## 2023-02-06 DIAGNOSIS — R1032 Left lower quadrant pain: Secondary | ICD-10-CM

## 2023-02-07 ENCOUNTER — Ambulatory Visit (HOSPITAL_COMMUNITY)
Admission: RE | Admit: 2023-02-07 | Discharge: 2023-02-07 | Disposition: A | Discharge status: Home / Self Care | Payer: 59 | Source: Ambulatory Visit | Attending: Family Medicine | Admitting: Family Medicine

## 2023-02-07 DIAGNOSIS — R1032 Left lower quadrant pain: Secondary | ICD-10-CM | POA: Insufficient documentation

## 2023-02-07 MED ORDER — IOHEXOL 300 MG/ML  SOLN
100.0000 mL | Freq: Once | INTRAMUSCULAR | Status: AC | PRN
  Administered 2023-02-07: 100 mL via INTRAVENOUS

## 2023-03-23 IMAGING — CT CT ABD-PELV W/ CM
2 of 4 series · 16 of 46 positions shown, 18 images · IV contrast (APPLIED)
Comparison: Report from CT of the abdomen and pelvis at [HOSPITAL]
[facility] on 08/12/2008

CLINICAL DATA: Abdominal pain in the upper/epigastric region.

EXAM:
CT ABDOMEN AND PELVIS WITH CONTRAST
TECHNIQUE: Multidetector CT imaging of the abdomen and pelvis was performed
using the standard protocol following bolus administration of
intravenous contrast.
CONTRAST:  80mL OMNIPAQUE IOHEXOL 300 MG/ML  SOLN

[Series 2: abd pel w · axial · 0.68mm/px · z∈[+764,+1174]mm · 13 of 90 slices shown, 15 images]
[im 4/90  soft-tissue]
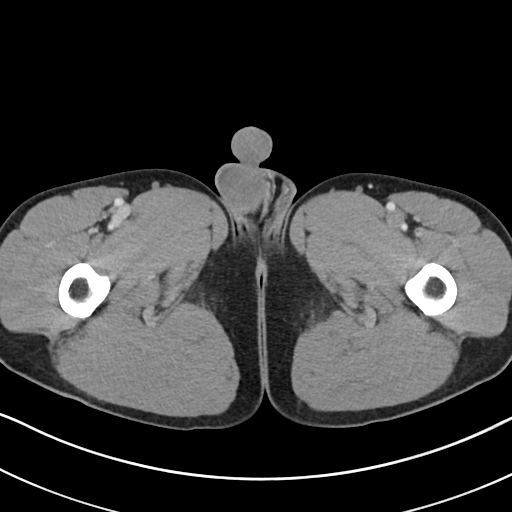
[im 4/90  bone]
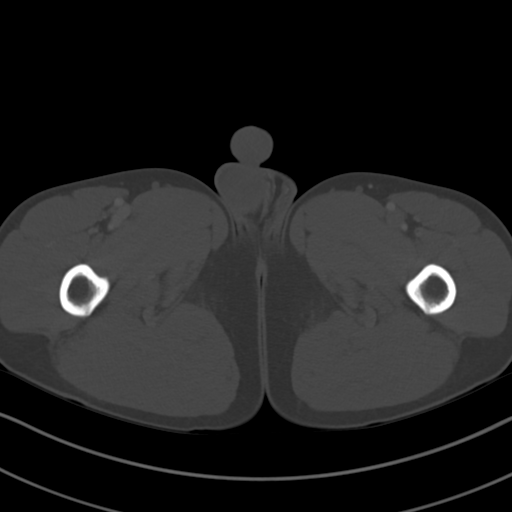
[im 11/90  soft-tissue]
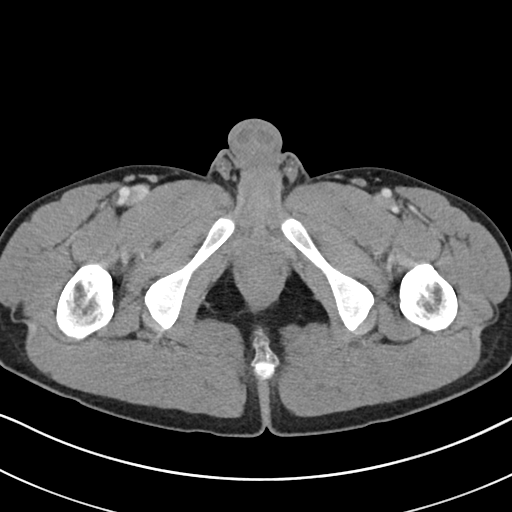
[im 18/90  soft-tissue]
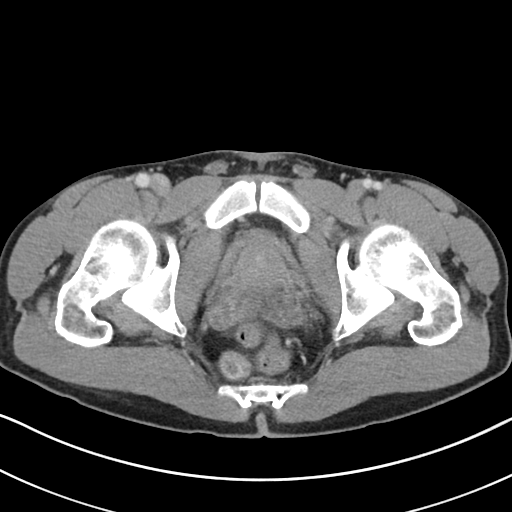
[im 25/90  soft-tissue]
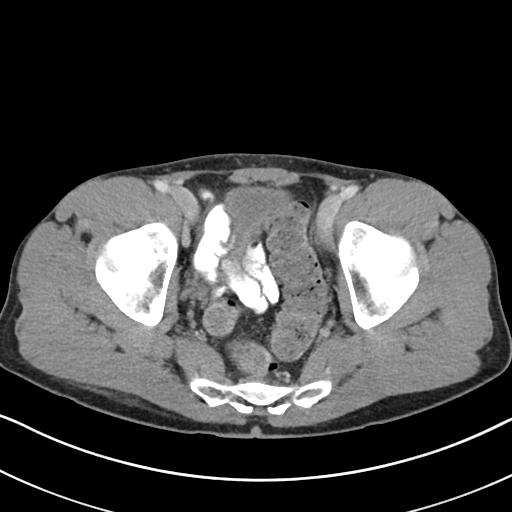
[im 33/90  soft-tissue]
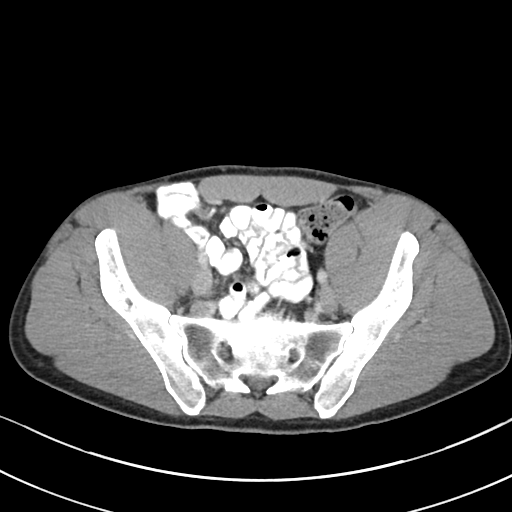
[im 40/90  soft-tissue]
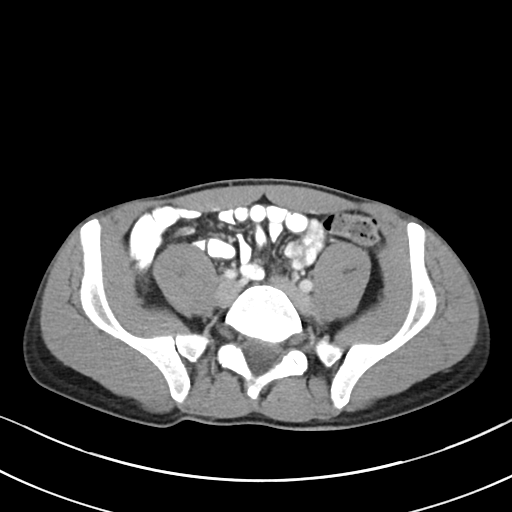
[im 47/90  soft-tissue]
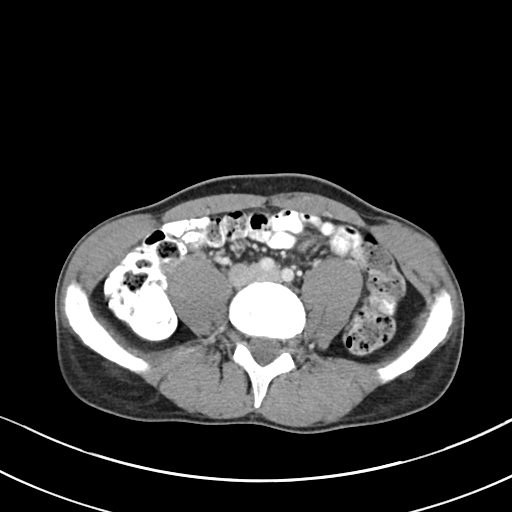
[im 50/90  soft-tissue]
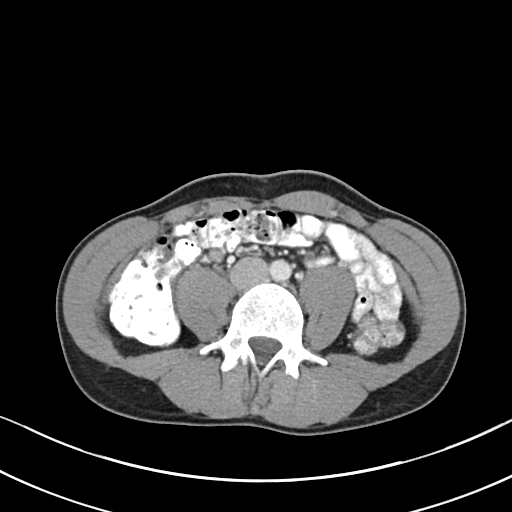
[im 57/90  soft-tissue]
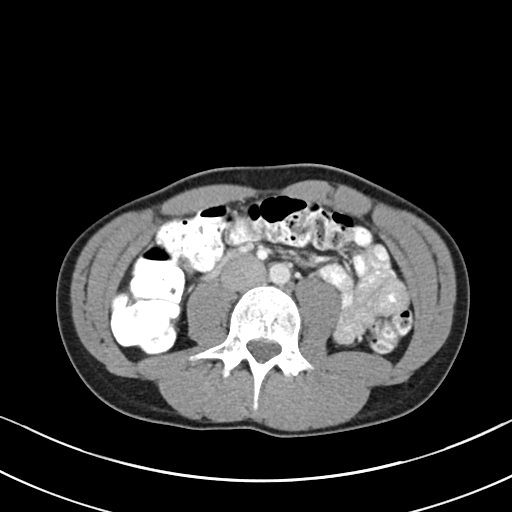
[im 57/90  bone]
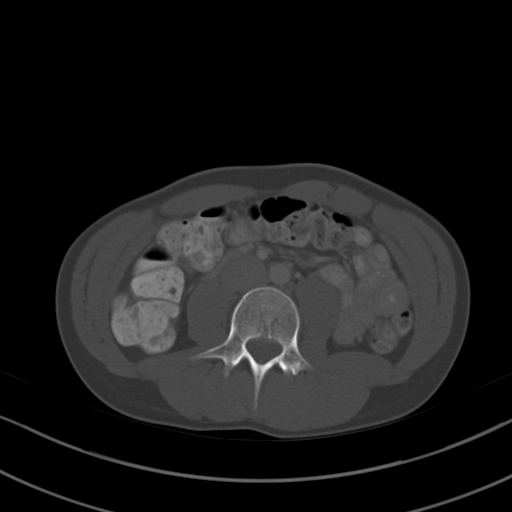
[im 65/90  soft-tissue]
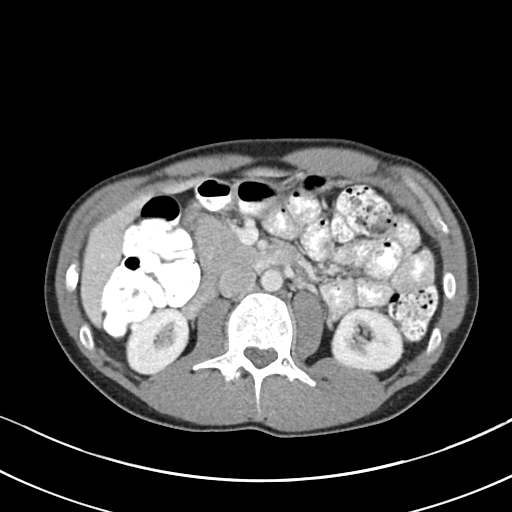
[im 72/90  soft-tissue]
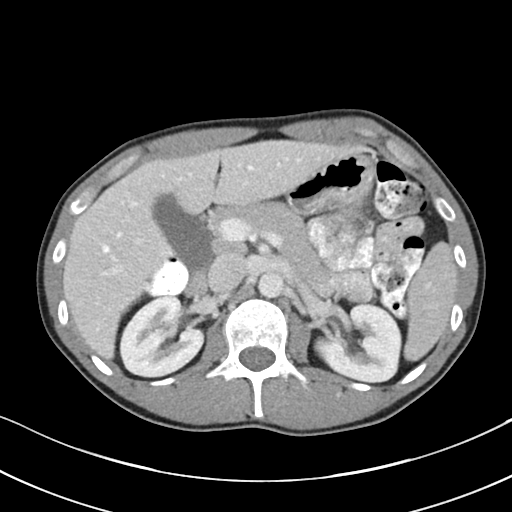
[im 79/90  soft-tissue]
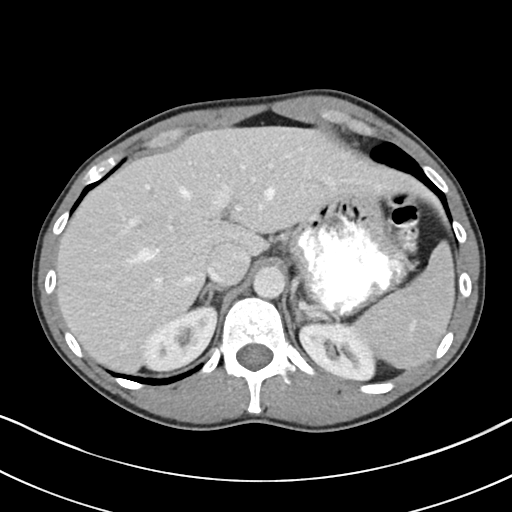
[im 86/90  soft-tissue]
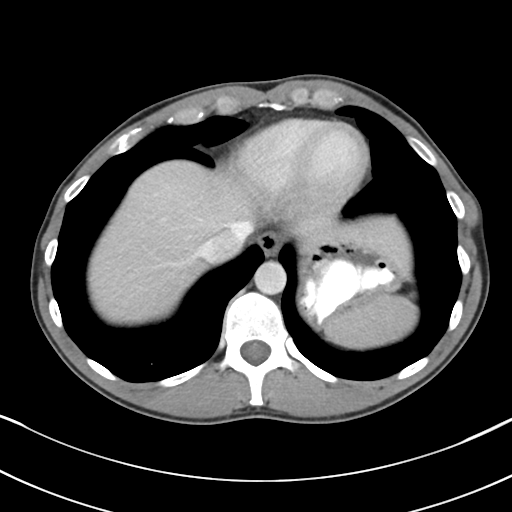

[Series 5: coronal · coronal · 0.79mm/px · 3 of 84 slices shown]
[im 28/84  soft-tissue]
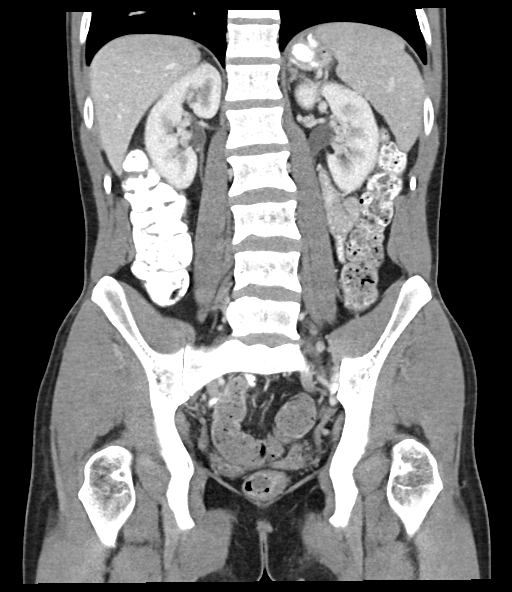
[im 37/84  soft-tissue]
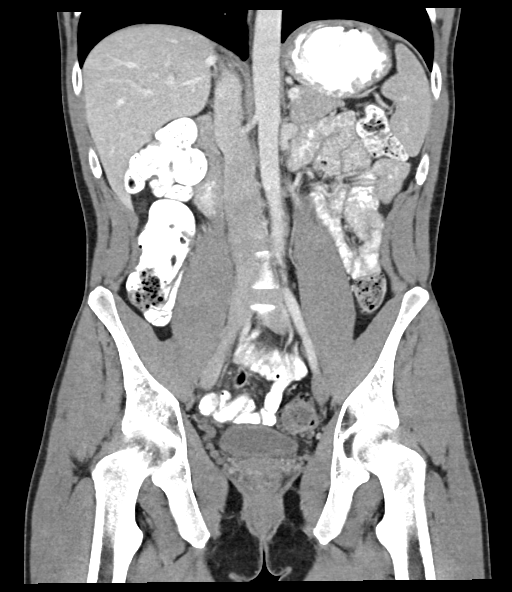
[im 47/84  soft-tissue]
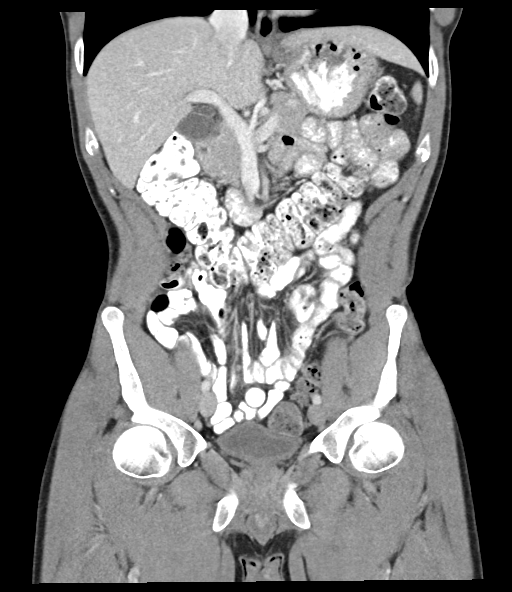

[16 of 46 positions shown; findings below may reference images not displayed]

FINDINGS: Lower chest: No acute abnormality.

Hepatobiliary: No focal liver abnormality is seen. No gallstones,
gallbladder wall thickening, or biliary dilatation.

Pancreas: Unremarkable. No pancreatic ductal dilatation or
surrounding inflammatory changes.

Spleen: Normal in size without focal abnormality.

Adrenals/Urinary Tract: Adrenal glands are unremarkable. Kidneys are
normal, without renal calculi, focal lesion, or hydronephrosis.
Bladder is unremarkable.

Stomach/Bowel: Bowel shows no evidence of obstruction, ileus,
inflammation or lesion. The appendix is difficult to visualize but
there is no evidence to suggest acute appendicitis. No evidence of
free intraperitoneal air.

Vascular/Lymphatic: No significant vascular findings are present. No
enlarged abdominal or pelvic lymph nodes.

Reproductive: Prostate is unremarkable.

Other: No abdominal wall hernia or abnormality. No abdominopelvic
ascites.

Musculoskeletal: No acute or significant osseous findings.
IMPRESSION: Normal CT of the abdomen and pelvis.

## 2023-05-05 ENCOUNTER — Ambulatory Visit: Payer: 59 | Admitting: Internal Medicine

## 2023-06-05 ENCOUNTER — Other Ambulatory Visit (HOSPITAL_COMMUNITY): Payer: Self-pay | Admitting: Family Medicine

## 2023-06-05 ENCOUNTER — Encounter (HOSPITAL_COMMUNITY): Payer: Self-pay | Admitting: Family Medicine

## 2023-06-05 DIAGNOSIS — K7689 Other specified diseases of liver: Secondary | ICD-10-CM

## 2023-06-13 ENCOUNTER — Ambulatory Visit: Payer: 59 | Admitting: Allergy & Immunology

## 2023-07-18 ENCOUNTER — Ambulatory Visit: Payer: 59 | Admitting: Allergy & Immunology

## 2023-07-18 ENCOUNTER — Encounter: Payer: Self-pay | Admitting: Allergy & Immunology

## 2023-07-18 ENCOUNTER — Other Ambulatory Visit: Payer: Self-pay

## 2023-07-18 VITALS — BP 130/80 | HR 105 | Temp 98.2°F | Resp 18 | Ht 74.0 in | Wt 162.6 lb

## 2023-07-18 DIAGNOSIS — K219 Gastro-esophageal reflux disease without esophagitis: Secondary | ICD-10-CM | POA: Diagnosis not present

## 2023-07-18 DIAGNOSIS — J302 Other seasonal allergic rhinitis: Secondary | ICD-10-CM

## 2023-07-18 DIAGNOSIS — J4540 Moderate persistent asthma, uncomplicated: Secondary | ICD-10-CM

## 2023-07-18 DIAGNOSIS — J3089 Other allergic rhinitis: Secondary | ICD-10-CM | POA: Diagnosis not present

## 2023-07-18 MED ORDER — PANTOPRAZOLE SODIUM 40 MG PO TBEC: 40.0000 mg | DELAYED_RELEASE_TABLET | Freq: Two times a day (BID) | ORAL | 1 refills | Status: DC

## 2023-07-18 NOTE — Progress Notes (Signed)
NEW PATIENT  Date of Service/Encounter:  07/18/23  Consult requested by: Christopher Stabile, MD   Assessment:   Seasonal and perennial allergic rhinitis (ragweed, trees, weeds, grasses, indoor molds and cockroach)   Moderate persistent asthma, uncomplicated - adding Breztri today  Gastroesophageal reflux disease - on PPI  Plan/Recommendations:   1. Chronic rhinitis - Because of insurance stipulations, we cannot do skin testing on the same day as your first visit. - We are all working to fight this, but for now we need to do two separate visits.  - We will know more after we do testing at the next visit.  - The skin testing visit can be squeezed in at your convenience.  - Then we can make a more full plan to address all of your symptoms. - We did do testing back in 2019, but we can get that updated.  - You were NOT allergic to animals back then, so this might have changed.  - Be sure to stop your antihistamines for 3 days before this appointment.   2. Moderate persistent asthma, uncomplicated - Lung testing looked excellent today. - But since you are having such shortness of breath and difficulty breathing, we should start a daily controller medication. - Sample of Breztri provided today. Christopher Frey contains three medications to help control inflammation in the   - Spacer use reviewed. - Daily controller medication(s): Breztri two puffs twice daily with a spacer  - Prior to physical activity: albuterol 2 puffs 10-15 minutes before physical activity. - Rescue medications: albuterol 4 puffs every 4-6 hours as needed - Asthma control goals:  * Full participation in all desired activities (may need albuterol before activity) * Albuterol use two time or less a week on average (not counting use with activity) * Cough interfering with sleep two time or less a month * Oral steroids no more than once a year * No hospitalizations  3. GERD  - Continue with Protonix twice daily.   4.  Return in about 1 week (around 07/25/2023) for ALLERGY TESTING (1-55). You can have the follow up appointment with Dr. Dellis Frey or a Nurse Practicioner (our Nurse Practitioners are excellent and always have Physician oversight!).     This note in its entirety was forwarded to the Provider who requested this consultation.  Subjective:   Christopher Frey is a 33 y.o. male presenting today for evaluation of  Chief Complaint  Patient presents with   Asthma   Advice Only    Consult only -  Environmental - cats/dogs worse    Christopher Frey has a history of the following: Patient Active Problem List   Diagnosis Date Noted   Difficulty walking 06/05/2012   Stiffness of joints, not elsewhere classified, multiple sites 06/05/2012    History obtained from: chart review and patient.  Discussed the use of AI scribe software for clinical note transcription with the patient and/or guardian, who gave verbal consent to proceed.  Christopher Frey was referred by Christopher Stabile, MD.     Christopher Frey is a 33 y.o. male presenting for an evaluation of allergies and asthma . We actually saw him in July 2019, but he never followed up then.  Asthma/Respiratory Symptom History: He experiences significant asthma symptoms, particularly when exposed to temperature changes or during illness. Episodes of severe coughing and difficulty breathing occur when he gets hot or cold, necessitating him to stop activities to catch his breath. These symptoms occur five to six times  a year, often requiring medical attention and treatment with prednisone. He uses a nebulizer for relief, which causes significant shaking, making it difficult to use at work. He has multiple inhalers, including a dark blue, a white and red, and a light blue one, but finds them ineffective. He experiences shortness of breath and dizziness when engaging in physical activities, such as playing with his children or doing sports, which he has had to give up. It does  not seem that he has ever been on a daily controller medication for his asthma. He does report using the albuterol nebulizer nightly in order to effectively breathe.   Allergic Rhinitis Symptom History: He has a history of allergies, with previous testing showing sensitivity to grass, ragweed, weeds, trees, indoor molds, and cockroach. He wears a mask when mowing the lawn to prevent exacerbation of symptoms. He has a history of epistaxis, particularly in cold weather, and had tubes as a child. He is open to repeat testing, especially in light of the fact that he flares especially when he is working at the [employer].   GERD Symptom History: He has gastroesophageal reflux disease (GERD), for which he takes Protonix twice daily. His reflux symptoms are influenced by his diet, and he tries to manage them by watching what he eats. He had an endoscopy performed in 2022 by Christopher Frey.  Infection Symptom History: He has a history of frequent sinus infections, often starting with drainage from his sinuses to his chest, triggering bronchitis. He was previously treated with amoxicillin. He is currently on cetirizine, which he takes daily, and fluticasone nasal spray, used once daily in each nostril, but still experiences frequent illness. He estimates that he gets antibiotics 4-6 times per year, although his current PCP does not give it out like his former PCP.   Immunodeficiency screen: Eight or more ear infections in one year: NO Two or more serious sinus infections in one year: YES Two or more bouts of pneumonia in one year: NO Two or more deep-seated infections, or infections in unusual areas: NO Recurrent deep skin or organ abscesses: NO Need for intravenous antibiotic therapy to clear infection: NO Infections with unusual or opportunistic organisms: NO Family history of primary immunodeficiency: NO Recurrent fevers: NO  Physical signs screen: Poor growth, failure to thrive:  NO Absent lymph nodes or tonsils: NO Skin lesions: telangiectasias, petechiae, dermatomyositis, lupus-like rash: NO Ataxia (with ataxia-telangiectasia): NO Oral thrush after one year of age: NO Oral ulcers: NO  Otherwise, there is no history of other atopic diseases, including drug allergies, stinging insect allergies, or contact dermatitis. There is no significant infectious history. Vaccinations are up to date.    Past Medical History: Patient Active Problem List   Diagnosis Date Noted   Difficulty walking 06/05/2012   Stiffness of joints, not elsewhere classified, multiple sites 06/05/2012    Medication List:  Allergies as of 07/18/2023   No Known Allergies      Medication List        Accurate as of July 18, 2023  4:51 PM. If you have any questions, ask your nurse or doctor.          STOP taking these medications    benzonatate 100 MG capsule Commonly known as: TESSALON Stopped by: Alfonse Spruce   predniSONE 20 MG tablet Commonly known as: DELTASONE Stopped by: Alfonse Spruce       TAKE these medications    albuterol (2.5 MG/3ML) 0.083% nebulizer solution Commonly  known as: PROVENTIL Take 3 mLs (2.5 mg total) by nebulization every 6 (six) hours as needed for wheezing or shortness of breath.   albuterol 108 (90 Base) MCG/ACT inhaler Commonly known as: VENTOLIN HFA Inhale 2 puffs into the lungs every 4 (four) hours as needed for wheezing or shortness of breath.   cetirizine 10 MG tablet Commonly known as: ZyrTEC Allergy Take 1 tablet (10 mg total) by mouth daily.   fluticasone 50 MCG/ACT nasal spray Commonly known as: FLONASE Place 1 spray into both nostrils 2 (two) times daily.   meloxicam 7.5 MG tablet Commonly known as: MOBIC Take 7.5 mg by mouth daily as needed for pain.   pantoprazole 40 MG tablet Commonly known as: PROTONIX Take 1 tablet (40 mg total) by mouth 2 (two) times daily before a meal.   sucralfate 1 g  tablet Commonly known as: Carafate Take 1 tablet (1 g total) by mouth 4 (four) times daily -  with meals and at bedtime.        Birth History: non-contributory  Developmental History: non-contributory  Past Surgical History: Past Surgical History:  Procedure Laterality Date   BIOPSY  05/08/2021   Procedure: BIOPSY;  Surgeon: Lanelle Bal, DO;  Location: [facility];  Service: Endoscopy;;   ESOPHAGOGASTRODUODENOSCOPY (EGD) WITH PROPOFOL N/A 05/08/2021   Procedure: ESOPHAGOGASTRODUODENOSCOPY (EGD) WITH PROPOFOL;  Surgeon: Lanelle Bal, DO;  Location: [facility];  Service: Endoscopy;  Laterality: N/A;  3:00pm   SHOULDER SURGERY       Family History: Family History  Problem Relation Age of Onset   Allergic rhinitis Father    Allergic rhinitis Brother    Angioedema Neg Hx    Asthma Neg Hx    Eczema Neg Hx    Immunodeficiency Neg Hx    Urticaria Neg Hx      Social History: Sherril lives at home with his wife and two children.  He lives in a house that is 17 years old.  There are hardwood floors throughout the home.  They have a heat pump for heating and central cooling.  There is 1 dog inside of the home.  There are dust mite covers on the bed, but not the pillows.  There is no tobacco exposure.  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  There is exposure to fumes, chemicals, and dust.  There is no HEPA filter in their home.  They do not live near an interstate or industrial area. There is a 7yo daughter and a 3yo son.    Review of systems otherwise negative other than that mentioned in the HPI.    Objective:   Blood pressure 130/80, pulse (!) 105, temperature 98.2 F (36.8 C), temperature source Temporal, resp. rate 18, height 6\' 2"  (1.88 m), weight 162 lb 9.6 oz (73.8 kg), SpO2 98%. Body mass index is 20.88 kg/m.     Physical Exam Vitals reviewed.  Constitutional:      Appearance: He is well-developed.  HENT:      Head: Normocephalic and atraumatic.     Right Ear: Tympanic membrane, ear canal and external ear normal. No drainage, swelling or tenderness. Tympanic membrane is not injected, scarred, erythematous, retracted or bulging.     Left Ear: Tympanic membrane, ear canal and external ear normal. No drainage, swelling or tenderness. Tympanic membrane is not injected, scarred, erythematous, retracted or bulging.     Nose: Rhinorrhea present. No nasal deformity, septal deviation or mucosal edema.  Right Turbinates: Enlarged, swollen and pale.     Left Turbinates: Enlarged, swollen and pale.     Right Sinus: No maxillary sinus tenderness or frontal sinus tenderness.     Left Sinus: No maxillary sinus tenderness or frontal sinus tenderness.     Mouth/Throat:     Mouth: Mucous membranes are not pale and not dry.     Pharynx: Uvula midline.  Eyes:     General:        Right eye: No discharge.        Left eye: No discharge.     Conjunctiva/sclera: Conjunctivae normal.     Right eye: Right conjunctiva is not injected. No chemosis.    Left eye: Left conjunctiva is not injected. No chemosis.    Pupils: Pupils are equal, round, and reactive to light.  Cardiovascular:     Rate and Rhythm: Normal rate and regular rhythm.     Heart sounds: Normal heart sounds.  Pulmonary:     Effort: Pulmonary effort is normal. No tachypnea, accessory muscle usage or respiratory distress.     Breath sounds: Normal breath sounds. No decreased air movement or transmitted upper airway sounds. No wheezing, rhonchi or rales.  Chest:     Chest wall: No tenderness.  Abdominal:     Tenderness: There is no abdominal tenderness. There is no guarding or rebound.  Lymphadenopathy:     Head:     Right side of head: No submandibular, tonsillar or occipital adenopathy.     Left side of head: No submandibular, tonsillar or occipital adenopathy.     Cervical: No cervical adenopathy.  Skin:    Coloration: Skin is not pale.      Findings: No abrasion, erythema, petechiae or rash. Rash is not papular, urticarial or vesicular.  Neurological:     Mental Status: He is alert.  Psychiatric:        Behavior: Behavior is cooperative.      Diagnostic studies:    Spirometry: results normal (FEV1: 4.41/89%, FVC: 5.59/91%, FEV1/FVC: 79%).    Spirometry consistent with normal pattern.   Allergy Studies: deferred due to insurance stipulations that require a separate visit for testing          Malachi Bonds, MD [facility]

## 2023-07-18 NOTE — Patient Instructions (Addendum)
1. Chronic rhinitis (Primary) - Because of insurance stipulations, we cannot do skin testing on the same day as your first visit. - We are all working to fight this, but for now we need to do two separate visits.  - We will know more after we do testing at the next visit.  - The skin testing visit can be squeezed in at your convenience.  - Then we can make a more full plan to address all of your symptoms. - We did do testing back in 2019, but we can get that updated.  - You were NOT allergic to animals back then, so this might have changed.  - Be sure to stop your antihistamines for 3 days before this appointment.   2. Moderate persistent asthma, uncomplicated - Lung testing looked excellent today. - But since you are having such shortness of breath and difficulty breathing, we should start a daily controller medication. - Sample of Breztri provided today. Markus Daft contains three medications to help control inflammation in the   - Spacer use reviewed. - Daily controller medication(s): Breztri two puffs twice daily with a spacer  - Prior to physical activity: albuterol 2 puffs 10-15 minutes before physical activity. - Rescue medications: albuterol 4 puffs every 4-6 hours as needed - Asthma control goals:  * Full participation in all desired activities (may need albuterol before activity) * Albuterol use two time or less a week on average (not counting use with activity) * Cough interfering with sleep two time or less a month * Oral steroids no more than once a year * No hospitalizations  3. GERD  - Continue with Protonix twice daily.   4. Return in about 1 week (around 07/25/2023) for ALLERGY TESTING (1-55). You can have the follow up appointment with Dr. Dellis Anes or a Nurse Practicioner (our Nurse Practitioners are excellent and always have Physician oversight!).    Please inform us of any Emergency Department visits, hospitalizations, or changes in symptoms. Call us before going to  the ED for breathing or allergy symptoms since we might be able to fit you in for a sick visit. Feel free to contact us anytime with any questions, problems, or concerns.  It was a pleasure to see you again today!  Websites that have reliable patient information: 1. American Academy of Asthma, Allergy, and Immunology: www.aaaai.org 2. Food Allergy Research and Education (FARE): foodallergy.org 3. Mothers of Asthmatics: http://www.asthmacommunitynetwork.org 4. American College of Allergy, Asthma, and Immunology: www.acaai.org      "Like" [place] on Facebook and Instagram for our latest updates!      A healthy democracy works best when [employer] participate! Make sure you are registered to vote! If you have moved or changed any of your contact information, you will need to get this updated before voting! Scan the QR codes below to learn more!

## 2023-07-24 ENCOUNTER — Telehealth: Payer: Self-pay | Admitting: Allergy & Immunology

## 2023-07-24 NOTE — Telephone Encounter (Signed)
Called and spoke with the patient and he stated that he did not call our office in regards to results.

## 2023-07-24 NOTE — Telephone Encounter (Signed)
Patient was calling in for test results

## 2023-07-24 NOTE — Telephone Encounter (Signed)
Patient called to go over his Spirometry from his previous visit. I went over his discharge and he informed me that the [facility] inhaler is not helping. He has been doing 2 puffs twice a day and has to use his albuterol inhaler constantly. He is having a lot of coughing, and difficulty breathing throughout the day.    Patient plans to keep allergy testing appointment on 07/30/23 and would like his asthma reevaluated at his appointment.

## 2023-07-29 NOTE — Telephone Encounter (Signed)
 Sounds good - we can discuss at his skin testing visit.

## 2023-07-30 ENCOUNTER — Encounter: Payer: Self-pay | Admitting: Allergy & Immunology

## 2023-07-30 ENCOUNTER — Ambulatory Visit: Payer: 59 | Admitting: Allergy & Immunology

## 2023-07-30 ENCOUNTER — Ambulatory Visit (HOSPITAL_COMMUNITY)
Admission: RE | Admit: 2023-07-30 | Discharge: 2023-07-30 | Disposition: A | Discharge status: Home / Self Care | Payer: 59 | Source: Ambulatory Visit | Attending: Allergy & Immunology | Admitting: Allergy & Immunology

## 2023-07-30 DIAGNOSIS — J3089 Other allergic rhinitis: Secondary | ICD-10-CM | POA: Diagnosis not present

## 2023-07-30 DIAGNOSIS — K219 Gastro-esophageal reflux disease without esophagitis: Secondary | ICD-10-CM

## 2023-07-30 DIAGNOSIS — J302 Other seasonal allergic rhinitis: Secondary | ICD-10-CM

## 2023-07-30 DIAGNOSIS — R058 Other specified cough: Secondary | ICD-10-CM | POA: Insufficient documentation

## 2023-07-30 DIAGNOSIS — J4540 Moderate persistent asthma, uncomplicated: Secondary | ICD-10-CM

## 2023-07-30 MED ORDER — PREDNISONE 10 MG PO TABS: ORAL_TABLET | ORAL | 0 refills | Status: DC

## 2023-07-30 MED ORDER — CARBINOXAMINE MALEATE 4 MG PO TABS: 4.0000 mg | ORAL_TABLET | Freq: Two times a day (BID) | ORAL | 5 refills | Status: DC

## 2023-07-30 MED ORDER — IPRATROPIUM BROMIDE 0.06 % NA SOLN: 2.0000 | Freq: Three times a day (TID) | NASAL | 5 refills | Status: AC

## 2023-07-30 NOTE — Progress Notes (Unsigned)
 FOLLOW UP  Date of Service/Encounter:  07/30/23   Assessment:   Seasonal and perennial allergic rhinitis (ragweed, trees, weeds, grasses, indoor molds, outdoor molds, dust mites, and cockroach)    Moderate persistent asthma, uncomplicated - failed Breztri   Gastroesophageal reflux disease - on PPI  Plan/Recommendations:   1. Chronic rhinitis  - Testing today showed: grasses, ragweed, weeds, trees, indoor molds, outdoor molds, and dust mites - Copy of test results provided.  - Avoidance measures provided. - Stop taking: cetirizine and fluticasone - Start taking: Carbinoxamine 4mg  tablet 3-4 times daily as needed and Atrovent (ipratropium) 0.06% one spray per nostril 3-4 times daily as needed (CAN BE OVER DRYING) - You can use an extra dose of the antihistamine, if needed, for breakthrough symptoms.  - Consider nasal saline rinses 1-2 times daily to remove allergens from the nasal cavities as well as help with mucous clearance (this is especially helpful to do before the nasal sprays are given) - Consider allergy shots as a means of long-term control. - Allergy shots "re-train" and "reset" the immune system to ignore environmental allergens and decrease the resulting immune response to those allergens (sneezing, itchy watery eyes, runny nose, nasal congestion, etc).    - Allergy shots improve symptoms in 75-85% of patients.  - We can discuss more at the next appointment if the medications are not working for you.  2. Moderate persistent asthma, uncomplicated - Lung testing not done. - I am not convinced that you have asthma since the [place] did not work, but clearly this picture is more than JUST asthma).  - I want to get the postnasal drip under control to see if that helps. - Definitely stop the [place].  - We are starting a prednisone taper. - Get a chest X-ray done today (this is ordered and you can just pop into [facility] to get this done). - Call us or MyChart  with an update on Friday. - We are going to change you from albuterol to AirSupra (this contains an inhaled steroid with the albuterol to help it to be used more effectively by your lungs). - Sample of AirSupra and copay card provided.  - Daily controller medication(s): NOTHING - Prior to physical activity: AirSupra 2 puffs 10-15 minutes before physical activity. - Rescue medications: AirSupra 2 puffs every 4-6 hours as needed - Asthma control goals:  * Full participation in all desired activities (may need albuterol before activity) * Albuterol use two time or less a week on average (not counting use with activity) * Cough interfering with sleep two time or less a month * Oral steroids no more than once a year * No hospitalizations  3. GERD  - Continue with Protonix twice daily.   4. Return in about 2 months (around 09/27/2023). You can have the follow up appointment with Dr. Dellis Anes or a Nurse Practicioner (our Nurse Practitioners are excellent and always have Physician oversight!).   Subjective:   Christopher Frey is a 33 y.o. male presenting today for follow up of No chief complaint on file.   Christopher Frey has a history of the following: Patient Active Problem List   Diagnosis Date Noted   Difficulty walking 06/05/2012   Stiffness of joints, not elsewhere classified, multiple sites 06/05/2012    History obtained from: chart review and patient.  Discussed the use of AI scribe software for clinical note transcription with the patient and/or guardian, who gave verbal consent to proceed.  Christopher Frey is  a 33 y.o. male presenting for skin testing. He was last seen on February 14. We could not do testing because his insurance company does not cover testing on the same day as a New Patient visit. He has been off of all antihistamines 3 days in anticipation of the testing.   At that time, we decided to do allergy testing to look more into environmental causes of his rhinitis symptoms.  For  his asthma, his lung testing looked excellent.  He was having a lot of shortness of breath and difficulty breathing, so we gave him a sample of Breztri to try.  We continue with albuterol as needed.  For his GERD, we continue with Protonix twice daily.  In the interim, he called on February 20 and reported that Markus Daft was making his coughing and difficulty breathing worse.  Otherwise, there have been no changes to his past medical history, surgical history, family history, or social history.    Review of systems otherwise negative other than that mentioned in the HPI.    Objective:   There were no vitals taken for this visit. There is no height or weight on file to calculate BMI.    Physical exam deferred since this was a skin testing appointment only.   Diagnostic studies:   Allergy Studies:     Airborne Adult Perc - 07/30/23 1129     Time Antigen Placed 1129    Allergen Manufacturer Waynette Buttery    Location Back    Number of Test 55    1. Control-Buffer 50% Glycerol Negative    2. Control-Histamine 2+    3. Bahia Negative    4. [place] Negative    5. Johnson Negative    6. Kentucky Blue Negative    7. Meadow Fescue Negative    8. Perennial Rye 2+    9. Timothy 2+    10. Ragweed Mix 2+    11. Cocklebur Negative    12. Plantain,  English 2+    13. Baccharis Negative    14. Dog Fennel 2+    15. Russian Thistle Negative    16. Lamb's Quarters Negative    17. Sheep Sorrell Negative    18. Rough Pigweed Negative    19. Marsh Elder, Rough 2+    20. Mugwort, Common 2+    21. Box, Elder Negative    22. Cedar, red 2+    23. Sweet Gum Negative    24. Pecan Pollen Negative    25. Pine Mix 3+    26. Walnut, Black Pollen Negative    27. Red Mulberry Negative    28. Ash Mix Negative    29. Birch Mix 3+    30. Beech American 3+    31. Cottonwood, [place] 2+    32. Hickory, White 2+    33. Maple Mix Negative    34. Oak, [place] Mix Negative    35. Sycamore Eastern Negative     36. Alternaria Alternata Negative    37. Cladosporium Herbarum Negative    38. Aspergillus Mix 3+    39. Penicillium Mix 2+    40. Bipolaris Sorokiniana (Helminthosporium) Negative    41. Drechslera Spicifera (Curvularia) Negative    42. Mucor Plumbeus 3+    43. Fusarium Moniliforme Negative    44. Aureobasidium Pullulans (pullulara) Negative    45. Rhizopus Oryzae 3+    46. Botrytis Cinera Negative    47. Epicoccum Nigrum Negative    48. Phoma Betae Negative  49. Dust Mite Mix Negative    50. Cat Hair 10,000 BAU/ml Negative    51.  Dog Epithelia Negative    52. Mixed Feathers Negative    53. Horse Epithelia Negative    54. Cockroach, German Negative    55. Tobacco Leaf 2+             Intradermal - 07/30/23 1159     Time Antigen Placed 1200    Allergen Manufacturer Waynette Buttery    Location Arm    Number of Test 9    Control Negative    Bahia Negative    French [place] Negative    Johnson Negative    Mold 1 Negative    Mite Mix 2+    Cat Negative    Dog Negative    Cockroach Negative             Allergy testing results were read and interpreted by myself, documented by clinical staff.      Malachi Bonds, MD  [facility]

## 2023-07-30 NOTE — Patient Instructions (Addendum)
 1. Chronic rhinitis  - Testing today showed: grasses, ragweed, weeds, trees, indoor molds, outdoor molds, and dust mites - Copy of test results provided.  - Avoidance measures provided. - Stop taking: cetirizine and fluticasone - Start taking: Carbinoxamine 4mg  tablet 3-4 times daily as needed and Atrovent (ipratropium) 0.06% one spray per nostril 3-4 times daily as needed (CAN BE OVER DRYING) - You can use an extra dose of the antihistamine, if needed, for breakthrough symptoms.  - Consider nasal saline rinses 1-2 times daily to remove allergens from the nasal cavities as well as help with mucous clearance (this is especially helpful to do before the nasal sprays are given) - Consider allergy shots as a means of long-term control. - Allergy shots "re-train" and "reset" the immune system to ignore environmental allergens and decrease the resulting immune response to those allergens (sneezing, itchy watery eyes, runny nose, nasal congestion, etc).    - Allergy shots improve symptoms in 75-85% of patients.  - We can discuss more at the next appointment if the medications are not working for you.  2. Moderate persistent asthma, uncomplicated - Lung testing not done. - I am not convinced that you have asthma since the [place]a did not work, but clearly this picture is more than JUST asthma).  - I want to get the postnasal drip under control to see if that helps. - Definitely stop the [place].  - We are starting a prednisone taper. - Get a chest X-ray done today (this is ordered and you can just pop into [facility] to get this done). - Call us or MyChart with an update on Friday. - We are going to change you from albuterol to AirSupra (this contains an inhaled steroid with the albuterol to help it to be used more effectively by your lungs). - Sample of AirSupra and copay card provided.  - Daily controller medication(s): NOTHING - Prior to physical activity: AirSupra 2 puffs 10-15 minutes  before physical activity. - Rescue medications: AirSupra 2 puffs every 4-6 hours as needed - Asthma control goals:  * Full participation in all desired activities (may need albuterol before activity) * Albuterol use two time or less a week on average (not counting use with activity) * Cough interfering with sleep two time or less a month * Oral steroids no more than once a year * No hospitalizations  3. GERD  - Continue with Protonix twice daily.   4. Return in about 2 months (around 09/27/2023). You can have the follow up appointment with Dr. Dellis Anes or a Nurse Practicioner (our Nurse Practitioners are excellent and always have Physician oversight!).    Please inform us of any Emergency Department visits, hospitalizations, or changes in symptoms. Call us before going to the ED for breathing or allergy symptoms since we might be able to fit you in for a sick visit. Feel free to contact us anytime with any questions, problems, or concerns.  It was a pleasure to see you again today!  Websites that have reliable patient information: 1. American Academy of Asthma, Allergy, and Immunology: www.aaaai.org 2. Food Allergy Research and Education (FARE): foodallergy.org 3. Mothers of Asthmatics: http://www.asthmacommunitynetwork.org 4. American College of Allergy, Asthma, and Immunology: www.acaai.org      "Like" [place] on Facebook and Instagram for our latest updates!      A healthy democracy works best when [employer] participate! Make sure you are registered to vote! If you have moved or changed any of your contact information, you  will need to get this updated before voting! Scan the QR codes below to learn more!       Airborne Adult Perc - 07/30/23 1129     Time Antigen Placed 1129    Allergen Manufacturer Waynette Buttery    Location Back    Number of Test 55    1. Control-Buffer 50% Glycerol Negative    2. Control-Histamine 2+    3. Bahia Negative    4. [place] Negative    5. Johnson  Negative    6. Kentucky Blue Negative    7. Meadow Fescue Negative    8. Perennial Rye 2+    9. Timothy 2+    10. Ragweed Mix 2+    11. Cocklebur Negative    12. Plantain,  English 2+    13. Baccharis Negative    14. Dog Fennel 2+    15. Russian Thistle Negative    16. Lamb's Quarters Negative    17. Sheep Sorrell Negative    18. Rough Pigweed Negative    19. Marsh Elder, Rough 2+    20. Mugwort, Common 2+    21. Box, Elder Negative    22. Cedar, red 2+    23. Sweet Gum Negative    24. Pecan Pollen Negative    25. Pine Mix 3+    26. Walnut, Black Pollen Negative    27. Red Mulberry Negative    28. Ash Mix Negative    29. Birch Mix 3+    30. Beech American 3+    31. Cottonwood, [place] 2+    32. Hickory, White 2+    33. Maple Mix Negative    34. Oak, [place] Mix Negative    35. Sycamore Eastern Negative    36. Alternaria Alternata Negative    37. Cladosporium Herbarum Negative    38. Aspergillus Mix 3+    39. Penicillium Mix 2+    40. Bipolaris Sorokiniana (Helminthosporium) Negative    41. Drechslera Spicifera (Curvularia) Negative    42. Mucor Plumbeus 3+    43. Fusarium Moniliforme Negative    44. Aureobasidium Pullulans (pullulara) Negative    45. Rhizopus Oryzae 3+    46. Botrytis Cinera Negative    47. Epicoccum Nigrum Negative    48. Phoma Betae Negative    49. Dust Mite Mix Negative    50. Cat Hair 10,000 BAU/ml Negative    51.  Dog Epithelia Negative    52. Mixed Feathers Negative    53. Horse Epithelia Negative    54. Cockroach, German Negative    55. Tobacco Leaf 2+             Intradermal - 07/30/23 1159     Time Antigen Placed 1200    Allergen Manufacturer Waynette Buttery    Location Arm    Number of Test 9    Control Negative    Bahia Negative    French [place] Territ[place] Negative    Johnson Negative    Mold 1 Negative    Mite Mix 2+    Cat Negative    Dog Negative    Cockroach Negative             Reducing Pollen Exposure  The American Academy of  Allergy, Asthma and Immunology suggests the following steps to reduce your exposure to pollen during allergy seasons.    Do not hang sheets or clothing out to dry; pollen may collect on these items. Do not mow lawns or spend time around freshly cut  grass; mowing stirs up pollen. Keep windows closed at night.  Keep car windows closed while driving. Minimize morning activities outdoors, a time when pollen counts are usually at their highest. Stay indoors as much as possible when pollen counts or humidity is high and on windy days when pollen tends to remain in the air longer. Use air conditioning when possible.  Many air conditioners have filters that trap the pollen spores. Use a HEPA room air filter to remove pollen form the indoor air you breathe.  Control of Mold Allergen   Mold and fungi can grow on a variety of surfaces provided certain temperature and moisture conditions exist.  Outdoor molds grow on plants, decaying vegetation and soil.  The major outdoor mold, Alternaria and Cladosporium, are found in very high numbers during hot and dry conditions.  Generally, a late Summer - Fall peak is seen for common outdoor fungal spores.  Rain will temporarily lower outdoor mold spore count, but counts rise rapidly when the rainy period ends.  The most important indoor molds are Aspergillus and Penicillium.  Dark, humid and poorly ventilated basements are ideal sites for mold growth.  The next most common sites of mold growth are the bathroom and the kitchen.  Outdoor (Seasonal) Mold Control  Positive outdoor molds via skin testing: Mucor  Use air conditioning and keep windows closed Avoid exposure to decaying vegetation. Avoid leaf raking. Avoid grain handling. Consider wearing a face mask if working in moldy areas.    Indoor (Perennial) Mold Control   Positive indoor molds via skin testing: Aspergillus, Penicillium, and Rhizopus  Maintain humidity below 50%. Clean washable surfaces with  5% bleach solution. Remove sources e.g. contaminated carpets.    Control of Dust Mite Allergen    Dust mites play a major role in allergic asthma and rhinitis.  They occur in environments with high humidity wherever human skin is found.  Dust mites absorb humidity from the atmosphere (ie, they do not drink) and feed on organic matter (including shed human and animal skin).  Dust mites are a microscopic type of insect that you cannot see with the naked eye.  High levels of dust mites have been detected from mattresses, pillows, carpets, upholstered furniture, bed covers, clothes, soft toys and any woven material.  The principal allergen of the dust mite is found in its feces.  A gram of dust may contain 1,000 mites and 250,000 fecal particles.  Mite antigen is easily measured in the air during house cleaning activities.  Dust mites do not bite and do not cause harm to humans, other than by triggering allergies/asthma.    Ways to decrease your exposure to dust mites in your home:  Encase mattresses, box springs and pillows with a mite-impermeable barrier or cover   Wash sheets, blankets and drapes weekly in hot water (130 F) with detergent and dry them in a dryer on the hot setting.  Have the room cleaned frequently with a vacuum cleaner and a damp dust-mop.  For carpeting or rugs, vacuuming with a vacuum cleaner equipped with a high-efficiency particulate air (HEPA) filter.  The dust mite allergic individual should not be in a room which is being cleaned and should wait 1 hour after cleaning before going into the room. Do not sleep on upholstered furniture (eg, couches).   If possible removing carpeting, upholstered furniture and drapery from the home is ideal.  Horizontal blinds should be eliminated in the rooms where the person spends the most time (bedroom,  study, television room).  Washable vinyl, roller-type shades are optimal. Remove all non-washable stuffed toys from the bedroom.  Wash  stuffed toys weekly like sheets and blankets above.   Reduce indoor humidity to less than 50%.  Inexpensive humidity monitors can be purchased at most hardware stores.  Do not use a humidifier as can make the problem worse and are not recommended.    Allergy Shots  Allergies are the result of a chain reaction that starts in the immune system. Your immune system controls how your body defends itself. For instance, if you have an allergy to pollen, your immune system identifies pollen as an invader or allergen. Your immune system overreacts by producing antibodies called Immunoglobulin E (IgE). These antibodies travel to cells that release chemicals, causing an allergic reaction.  The concept behind allergy immunotherapy, whether it is received in the form of shots or tablets, is that the immune system can be desensitized to specific allergens that trigger allergy symptoms. Although it requires time and patience, the payback can be long-term relief. Allergy injections contain a dilute solution of those substances that you are allergic to based upon your skin testing and allergy history.   How Do Allergy Shots Work?  Allergy shots work much like a vaccine. Your body responds to injected amounts of a particular allergen given in increasing doses, eventually developing a resistance and tolerance to it. Allergy shots can lead to decreased, minimal or no allergy symptoms.  There generally are two phases: build-up and maintenance. Build-up often ranges from three to six months and involves receiving injections with increasing amounts of the allergens. The shots are typically given once or twice a week, though more rapid build-up schedules are sometimes used.  The maintenance phase begins when the most effective dose is reached. This dose is different for each person, depending on how allergic you are and your response to the build-up injections. Once the maintenance dose is reached, there are longer periods  between injections, typically two to four weeks.  Occasionally doctors give cortisone-type shots that can temporarily reduce allergy symptoms. These types of shots are different and should not be confused with allergy immunotherapy shots.  Who Can Be Treated with Allergy Shots?  Allergy shots may be a good treatment approach for people with allergic rhinitis (hay fever), allergic asthma, conjunctivitis (eye allergy) or stinging insect allergy.   Before deciding to begin allergy shots, you should consider:   The length of allergy season and the severity of your symptoms  Whether medications and/or changes to your environment can control your symptoms  Your desire to avoid long-term medication use  Time: allergy immunotherapy requires a major time commitment  Cost: may vary depending on your insurance coverage  Allergy shots for children age 84 and older are effective and often well tolerated. They might prevent the onset of new allergen sensitivities or the progression to asthma.  Allergy shots are not started on patients who are pregnant but can be continued on patients who become pregnant while receiving them. In some patients with other medical conditions or who take certain common medications, allergy shots may be of risk. It is important to mention other medications you talk to your allergist.   What are the two types of build-ups offered:   RUSH or Rapid Desensitization -- one day of injections lasting from 8:30-4:30pm, injections every 1 hour.  Approximately half of the build-up process is completed in that one day.  The following week, normal build-up is resumed, and  this entails ~16 visits either weekly or twice weekly, until reaching your "maintenance dose" which is continued weekly until eventually getting spaced out to every month for a duration of 3 to 5 years. The regular build-up appointments are nurse visits where the injections are administered, followed by required  monitoring for 30 minutes.    Traditional build-up -- weekly visits for 6 -12 months until reaching "maintenance dose", then continue weekly until eventually spacing out to every 4 weeks as above. At these appointments, the injections are administered, followed by required monitoring for 30 minutes.     Either way is acceptable, and both are equally effective. With the rush protocol, the advantage is that less time is spent here for injections overall AND you would also reach maintenance dosing faster (which is when the clinical benefit starts to become more apparent). Not everyone is a candidate for rapid desensitization.   IF we proceed with the RUSH protocol, there are premedications which must be taken the day before and the day after the rush only (this includes antihistamines, steroids, and Singulair).  After the rush day, no prednisone or Singulair is required, and we just recommend antihistamines taken on your injection day.  What Is An Estimate of the Costs?  If you are interested in starting allergy injections, please check with your insurance company about your coverage for both allergy vial sets and allergy injections.  Please do so prior to making the appointment to start injections.  The following are CPT codes to give to your insurance company. These are the amounts we BILL to the insurance company, but the amount YOU WILL PAY and WE RECEIVE IS SUBSTANTIALLY LESS and depends on the contracts we have with different insurance companies.   Amount Billed to Insurance Two allergy vial set  CPT 95165   $ 2400      Two injections   CPT 95117   $ 40  RUSH (Rapid Desensitization) CPT 95180 x 8 hours  $500/hour  Regarding the allergy injections, your co-pay may or may not apply with each injection, so please confirm this with your insurance company. When you start allergy injections, 1 or 2 sets of vials are made based on your allergies.  Not all patients can be on one set of vials. A set of  vials lasts 6 months to a year depending on how quickly you can proceed with your build-up of your allergy injections. Vials are personalized for each patient depending on their specific allergens.  How often are allergy injection given during the build-up period?   Injections are given at least weekly during the build-up period until your maintenance dose is achieved. Per the doctor's discretion, you may have the option of getting allergy injections two times per week during the build-up period. However, there must be at least 48 hours between injections. The build-up period is usually completed within 6-12 months depending on your ability to schedule injections and for adjustments for reactions. When maintenance dose is reached, your injection schedule is gradually changed to every two weeks and later to every three weeks. Injections will then continue every 4 weeks. Usually, injections are continued for a total of 3-5 years.   When Will I Feel Better?  Some may experience decreased allergy symptoms during the build-up phase. For others, it may take as long as 12 months on the maintenance dose. If there is no improvement after a year of maintenance, your allergist will discuss other treatment options with you.  If you  aren't responding to allergy shots, it may be because there is not enough dose of the allergen in your vaccine or there are missing allergens that were not identified during your allergy testing. Other reasons could be that there are high levels of the allergen in your environment or major exposure to non-allergic triggers like tobacco smoke.  What Is the Length of Treatment?  Once the maintenance dose is reached, allergy shots are generally continued for three to five years. The decision to stop should be discussed with your allergist at that time. Some people may experience a permanent reduction of allergy symptoms. Others may relapse and a longer course of allergy shots can be  considered.  What Are the Possible Reactions?  The two types of adverse reactions that can occur with allergy shots are local and systemic. Common local reactions include very mild redness and swelling at the injection site, which can happen immediately or several hours after. Report a delayed reaction from your last injection. These include arm swelling or runny nose, watery eyes or cough that occurs within 12-24 hours after injection. A systemic reaction, which is less common, affects the entire body or a particular body system. They are usually mild and typically respond quickly to medications. Signs include increased allergy symptoms such as sneezing, a stuffy nose or hives.   Rarely, a serious systemic reaction called anaphylaxis can develop. Symptoms include swelling in the throat, wheezing, a feeling of tightness in the chest, nausea or dizziness. Most serious systemic reactions develop within 30 minutes of allergy shots. This is why it is strongly recommended you wait in your doctor's office for 30 minutes after your injections. Your allergist is trained to watch for reactions, and his or her staff is trained and equipped with the proper medications to identify and treat them.   Report to the nurse immediately if you experience any of the following symptoms: swelling, itching or redness of the skin, hives, watery eyes/nose, breathing difficulty, excessive sneezing, coughing, stomach pain, diarrhea, or light headedness. These symptoms may occur within 15-20 minutes after injection and may require medication.   Who Should Administer Allergy Shots?  The preferred location for receiving shots is your prescribing allergist's office. Injections can sometimes be given at another facility where the physician and staff are trained to recognize and treat reactions, and have received instructions by your prescribing allergist.  What if I am late for an injection?   Injection dose will be adjusted  depending upon how many days or weeks you are late for your injection.   What if I am sick?   Please report any illness to the nurse before receiving injections. She may adjust your dose or postpone injections depending on your symptoms. If you have fever, flu, sinus infection or chest congestion it is best to postpone allergy injections until you are better. Never get an allergy injection if your asthma is causing you problems. If your symptoms persist, seek out medical care to get your health problem under control.  What If I am or Become Pregnant:  Women that become pregnant should schedule an appointment with [facility] before receiving any further allergy injections.

## 2023-08-01 ENCOUNTER — Encounter: Payer: Self-pay | Admitting: Allergy & Immunology

## 2023-08-12 NOTE — Telephone Encounter (Signed)
 Letter written. I will ask the front desk to email the letter to the preferred email address.   Malachi Bonds, MD [facility]

## 2023-10-01 ENCOUNTER — Ambulatory Visit: Payer: 59 | Admitting: Family Medicine

## 2023-10-02 ENCOUNTER — Other Ambulatory Visit (HOSPITAL_COMMUNITY): Payer: Self-pay | Admitting: Internal Medicine

## 2023-10-02 DIAGNOSIS — M545 Low back pain, unspecified: Secondary | ICD-10-CM

## 2023-10-03 ENCOUNTER — Ambulatory Visit: Admitting: Family Medicine

## 2023-10-03 ENCOUNTER — Encounter: Payer: Self-pay | Admitting: Family Medicine

## 2023-10-03 VITALS — BP 130/84 | HR 124 | Temp 98.6°F | Resp 16 | Wt 165.2 lb

## 2023-10-03 DIAGNOSIS — J302 Other seasonal allergic rhinitis: Secondary | ICD-10-CM | POA: Diagnosis not present

## 2023-10-03 DIAGNOSIS — J3089 Other allergic rhinitis: Secondary | ICD-10-CM

## 2023-10-03 DIAGNOSIS — J4521 Mild intermittent asthma with (acute) exacerbation: Secondary | ICD-10-CM | POA: Diagnosis not present

## 2023-10-03 DIAGNOSIS — K219 Gastro-esophageal reflux disease without esophagitis: Secondary | ICD-10-CM

## 2023-10-03 MED ORDER — MONTELUKAST SODIUM 10 MG PO TABS: 10.0000 mg | ORAL_TABLET | Freq: Every day | ORAL | 5 refills | Status: DC

## 2023-10-03 MED ORDER — RYALTRIS 665-25 MCG/ACT NA SUSP: 2.0000 | Freq: Two times a day (BID) | NASAL | 5 refills | Status: DC | PRN

## 2023-10-03 NOTE — Progress Notes (Unsigned)
 [address] [place] [address] Dept: [phone]  FOLLOW UP NOTE  Patient ID: Christopher Frey, male    DOB: May 26, 1991  Age: 33 y.o. MRN: [id number] Date of Office Visit: 10/03/2023  Assessment  Chief Complaint: Follow-up (Has been sick 3 times since he last came. Allergy  medications are not working. )  HPI Christopher Frey is a 33 year old male who presents to the clinic for follow-up visit.  He was last seen in this clinic on 07/30/2023 by Dr. Idolina Maker for evaluation of asthma, allergic rhinitis, and reflux.    At today's visit, he reports occasionally feeling shortness of breath due to post nasal drainage. He denies cough or wheeze with activity or rest.  He reports that he has not used his Airsupra since his last visit to this clinic.    Allergic rhinitis is reported as poorly controlled with symptoms including clear rhinorrhea, nasal congestion, sneezing, and copious postnasal drainage with frequent throat clearing.  He is currently using Atrovent  nasal spray with moderate relief of symptoms.  He continues carbinoxamine  4 mg tablets 2 tablets twice a day with no relief of symptoms.  He reports that he has tried Zyrtec , Xyzal, Allegra, and Claritin with no relief of symptoms.  We discussed the importance of rotating antihistamines frequently and he agrees to rotate antihistamines every 30 days.  He is interested in allergen immunotherapy and written information was given at today's visit.  His last environmental allergy  skin testing was on 07/30/2023 and was positive to ragweed pollen, tree pollen, weed pollen, grass, mold, dust mite, and cockroach.  Reflux is reported as well-controlled with only occasional heartburn.  He continues pantoprazole  40 mg twice a day with relief of symptoms.  He reports that he does take this medication at least 30 minutes before a meal.  His current medications are listed in the chart.  Drug Allergies:  No Known Allergies  Physical  Exam: BP 130/84   Pulse (!) 124   Temp 98.6 F (37 C)   Resp 16   Wt 165 lb 4 oz (75 kg)   SpO2 97%   BMI 21.22 kg/m    Physical Exam Vitals reviewed.  Constitutional:      Appearance: Normal appearance.  HENT:     Head: Normocephalic and atraumatic.     Right Ear: Tympanic membrane normal.     Left Ear: Tympanic membrane normal.     Nose:     Comments: Bilateral nares edematous and pale with thin clear nasal drainage noted.  Pharynx erythematous with no exudate.  Ears normal.  Eyes normal. Cardiovascular:     Rate and Rhythm: Regular rhythm.     Heart sounds: Normal heart sounds. No murmur heard. Pulmonary:     Effort: Pulmonary effort is normal.     Breath sounds: Normal breath sounds.     Comments: Lungs clear to auscultation Musculoskeletal:        General: Normal range of motion.     Cervical back: Normal range of motion and neck supple.  Skin:    General: Skin is warm and dry.  Neurological:     Mental Status: He is alert and oriented to person, place, and time.  Psychiatric:        Mood and Affect: Mood normal.        Behavior: Behavior normal.        Thought Content: Thought content normal.        Judgment: Judgment normal.  Assessment and Plan: 1. Seasonal and perennial allergic rhinitis   2. Gastroesophageal reflux disease, unspecified whether esophagitis present   3. Mild intermittent asthma with acute exacerbation     Meds ordered this encounter  Medications   montelukast (SINGULAIR) 10 MG tablet    Sig: Take 1 tablet (10 mg total) by mouth at bedtime.    Dispense:  30 tablet    Refill:  5   Olopatadine-Mometasone (RYALTRIS) 665-25 MCG/ACT SUSP    Sig: Place 2 sprays into the nose 2 (two) times daily as needed.    Dispense:  29 g    Refill:  5    Patient Instructions  Asthma Continue Airsupra 2 puffs as needed.  Do not use more than 12 puffs in a 24-hour time span  Allergic rhinitis Restart montelukast 10 mg once a day to control  allergy  symptoms Continue allergen avoidance measures directed toward grass pollen, weed pollen, ragweed pollen, tree pollen, mold, dust mite, and cockroach as listed below Begin Ryaltris 2 sprays in each nostril up to twice a day if needed for nasal symptoms Continue an antihistamine once a day if needed for a runny nose. You may take an additional dose of antihistamine once a day if needed. Remember to rotate to a different antihistamine about every 3 months. Some examples of over the counter antihistamines include Zyrtec  (cetirizine ), Xyzal (levocetirizine), Allegra (fexofenadine), and Claritin (loratidine).  Continue Atrovent  2 sprays in each nostril up to 2 times a day if needed for runny nose Consider saline nasal rinses as needed for nasal symptoms. Use this before any medicated nasal sprays for best result Consider allergen immunotherapy if your symptoms are not well-controlled with the treatment plan as listed above. Call your insurance to find out more about coverage. Then call the clinic for your first injection appointment. After the first appointment just come to the clinic 1-2 times a week during injection room hours.   Reflux Continue dietary lifestyle modifications as listed below Continue to follow-up with your GI provider as recommended Continue Protonix  40 mg twice a day to control reflux  Call the clinic if this treatment plan is not working well for you.  Follow up in 3 months or sooner if needed.   Return in about 3 months (around 01/03/2024), or if symptoms worsen or fail to improve.    Thank you for the opportunity to care for this patient.  Please do not hesitate to contact me with questions.  Marinus Sic, FNP [facility] 

## 2023-10-03 NOTE — Patient Instructions (Addendum)
 Asthma Continue Airsupra 2 puffs as needed.  Do not use more than 12 puffs in a 24-hour time span  Allergic rhinitis Restart montelukast 10 mg once a day to control allergy  symptoms Continue allergen avoidance measures directed toward grass pollen, weed pollen, ragweed pollen, tree pollen, mold, dust mite, and cockroach as listed below Begin Ryaltris 2 sprays in each nostril up to twice a day if needed for nasal symptoms Continue an antihistamine once a day if needed for a runny nose. You may take an additional dose of antihistamine once a day if needed. Remember to rotate to a different antihistamine about every 3 months. Some examples of over the counter antihistamines include Zyrtec  (cetirizine ), Xyzal (levocetirizine), Allegra (fexofenadine), and Claritin (loratidine).  Continue Atrovent  2 sprays in each nostril up to 2 times a day if needed for runny nose Consider saline nasal rinses as needed for nasal symptoms. Use this before any medicated nasal sprays for best result Consider allergen immunotherapy if your symptoms are not well-controlled with the treatment plan as listed above. Call your insurance to find out more about coverage. Then call the clinic for your first injection appointment. After the first appointment just come to the clinic 1-2 times a week during injection room hours.   Reflux Continue dietary lifestyle modifications as listed below Continue to follow-up with your GI provider as recommended Continue Protonix  40 mg twice a day to control reflux  Call the clinic if this treatment plan is not working well for you.  Follow up in 3 months or sooner if needed.  Reducing Pollen Exposure The American Academy of Allergy , Asthma and Immunology suggests the following steps to reduce your exposure to pollen during allergy  seasons. Do not hang sheets or clothing out to dry; pollen may collect on these items. Do not mow lawns or spend time around freshly cut grass; mowing stirs up  pollen. Keep windows closed at night.  Keep car windows closed while driving. Minimize morning activities outdoors, a time when pollen counts are usually at their highest. Stay indoors as much as possible when pollen counts or humidity is high and on windy days when pollen tends to remain in the air longer. Use air conditioning when possible.  Many air conditioners have filters that trap the pollen spores. Use a HEPA room air filter to remove pollen form the indoor air you breathe.  Control of Mold Allergen Mold and fungi can grow on a variety of surfaces provided certain temperature and moisture conditions exist.  Outdoor molds grow on plants, decaying vegetation and soil.  The major outdoor mold, Alternaria and Cladosporium, are found in very high numbers during hot and dry conditions.  Generally, a late Summer - Fall peak is seen for common outdoor fungal spores.  Rain will temporarily lower outdoor mold spore count, but counts rise rapidly when the rainy period ends.  The most important indoor molds are Aspergillus and Penicillium.  Dark, humid and poorly ventilated basements are ideal sites for mold growth.  The next most common sites of mold growth are the bathroom and the kitchen.  Outdoor [employer] Use air conditioning and keep windows closed Avoid exposure to decaying vegetation. Avoid leaf raking. Avoid grain handling. Consider wearing a face mask if working in moldy areas.  Indoor Mold Control Maintain humidity below 50%. Clean washable surfaces with 5% bleach solution. Remove sources e.g. Contaminated carpets.   Control of Dust Mite Allergen Dust mites play a major role in allergic asthma and rhinitis. They  occur in environments with high humidity wherever human skin is found. Dust mites absorb humidity from the atmosphere (ie, they do not drink) and feed on organic matter (including shed human and animal skin). Dust mites are a microscopic type of insect that you cannot see  with the naked eye. High levels of dust mites have been detected from mattresses, pillows, carpets, upholstered furniture, bed covers, clothes, soft toys and any woven material. The principal allergen of the dust mite is found in its feces. A gram of dust may contain 1,000 mites and 250,000 fecal particles. Mite antigen is easily measured in the air during house cleaning activities. Dust mites do not bite and do not cause harm to humans, other than by triggering allergies/asthma.  Ways to decrease your exposure to dust mites in your home:  1. Encase mattresses, box springs and pillows with a mite-impermeable barrier or cover  2. Wash sheets, blankets and drapes weekly in hot water (130 F) with detergent and dry them in a dryer on the hot setting.  3. Have the room cleaned frequently with a vacuum cleaner and a damp dust-mop. For carpeting or rugs, vacuuming with a vacuum cleaner equipped with a high-efficiency particulate air (HEPA) filter. The dust mite allergic individual should not be in a room which is being cleaned and should wait 1 hour after cleaning before going into the room.  4. Do not sleep on upholstered furniture (eg, couches).  5. If possible removing carpeting, upholstered furniture and drapery from the home is ideal. Horizontal blinds should be eliminated in the rooms where the person spends the most time (bedroom, study, television room). Washable vinyl, roller-type shades are optimal.  6. Remove all non-washable stuffed toys from the bedroom. Wash stuffed toys weekly like sheets and blankets above.  7. Reduce indoor humidity to less than 50%. Inexpensive humidity monitors can be purchased at most hardware stores. Do not use a humidifier as can make the problem worse and are not recommended.  Control of Cockroach Allergen Cockroach allergen has been identified as an important cause of acute attacks of asthma, especially in urban settings.  There are fifty-five species of  cockroach that exist in the [place] , however only three, the [place], [place] and [place] species produce allergen that can affect patients with Asthma.  Allergens can be obtained from fecal particles, egg casings and secretions from cockroaches.    Remove food sources. Reduce access to water. Seal access and entry points. Spray runways with 0.5-1% Diazinon or Chlorpyrifos Blow boric acid power under stoves and refrigerator. Place bait stations (hydramethylnon) at feeding sites.

## 2023-10-09 ENCOUNTER — Ambulatory Visit (HOSPITAL_COMMUNITY)
Admission: RE | Admit: 2023-10-09 | Discharge: 2023-10-09 | Disposition: A | Discharge status: Home / Self Care | Source: Ambulatory Visit | Attending: Internal Medicine | Admitting: Internal Medicine

## 2023-10-09 DIAGNOSIS — M545 Low back pain, unspecified: Secondary | ICD-10-CM | POA: Diagnosis present

## 2023-10-16 DIAGNOSIS — J301 Allergic rhinitis due to pollen: Secondary | ICD-10-CM | POA: Diagnosis not present

## 2023-10-16 NOTE — Addendum Note (Signed)
 Addended by: Rochester Chuck on: 10/16/2023 08:44 AM   Modules accepted: Orders

## 2023-10-16 NOTE — Progress Notes (Signed)
 VIAL SET ONE G-W-T MADE 10-16-23. EXP 10-15-24

## 2023-10-16 NOTE — Progress Notes (Signed)
 Aeroallergen Immunotherapy   Ordering Provider: Dr. Drexel Gentles   Patient Details  Name: Christopher Frey  MRN: [id number]  Date of Birth: 1991/03/19   Order 2 of 2   Vial Label: RW/Molds/DM   0.6 ml (Volume)  1:20 Concentration -- Ragweed Mix  Airsupra0.2 ml (Volume)  1:10 Concentration -- Aspergillus mix  0.2 ml (Volume)  1:10 Concentration -- Penicillium mix  0.2 ml (Volume)  1:10 Concentration -- Mucor plumbeus  0.2 ml (Volume)  1:10 Concentration -- Rhizopus oryzae  0.8 ml (Volume)   AU Concentration -- Mite Mix (DF 5,000 & DP 5,000)    2.0  ml Extract Subtotal  3.0  ml Diluent   5.0  ml Maintenance Total   Schedule:  B  Silver Vial (1:1,000,000):  Blue Vial (1:100,000): Schedule B (6 doses)  Yellow Vial (1:10,000): Schedule B (6 doses)  Green Vial (1:1,000): Schedule B (6 doses)  Red Vial (1:100): Schedule A (12 doses)   Special Instructions: After completion of the first Red Vial, please space to every two weeks. After completion of the second Red Vial, please space to every 4 weeks. Ok to up dose new vials at 0.14mL --> 0.3 mL --> 0.5 mL. Ok to come twice weekly, if desired, as long as there is 48 hours between injections.

## 2023-10-16 NOTE — Progress Notes (Signed)
 Aeroallergen Immunotherapy  Ordering Provider: Dr. Drexel Gentles  Patient Details Name: Christopher Frey MRN: [id number] Date of Birth: 1990/06/22  Order 1 of 2  Vial Label: G/W/T  0.3 ml (Volume)  BAU Concentration -- 7 Grass Mix* 100,000 ([place]  Blue, Cel[place], Orchard, Perennial Rye, RedTop, Sweet Vernal, Timothy) 0.2 ml (Volume)  1:20 Concentration -- Cocklebur 0.2 ml (Volume)  1:80 Concentration -- Dogfennel 0.5 ml (Volume)  1:20 Concentration -- Weed Mix* 0.5 ml (Volume)  1:20 Concentration -- Eastern 10 Tree Mix (also Sweet Gum) 0.2 ml (Volume)  1:10 Concentration -- Amelia Jurist mix* 0.2 ml (Volume)  1:20 Concentration -- [place]ola Manila American* 0.2 ml (Volume)  1:10 Concentration -- Cedar, red 0.2 ml (Volume)  1:20 Concentration -- Cottonwood, Eastern* 0.2 ml (Volume)  1:10 Concentration -- Hickory*   2.7  ml Extract Subtotal 2.3  ml Diluent  5.0  ml Maintenance Total  Schedule:  B  Blue Vial (1:100,000): Schedule B (6 doses) Yellow Vial (1:10,000): Schedule B (6 doses) Green Vial (1:1,000): Schedule B (6 doses) Red Vial (1:100): Schedule A (12 doses)  Special Instructions: After completion of the first Red Vial, please space to every two weeks. After completion of the second Red Vial, please space to every 4 weeks. Ok to up dose new vials at 0.64mL --> 0.3 mL --> 0.5 mL. Ok to come twice weekly, if desired, as long as there is 48 hours between injections.

## 2023-10-17 DIAGNOSIS — J3089 Other allergic rhinitis: Secondary | ICD-10-CM | POA: Diagnosis not present

## 2023-10-17 NOTE — Progress Notes (Signed)
 VIAL SET RW-MOLD-DM MADE 10-17-23. EXP 10-16-24

## 2023-11-03 ENCOUNTER — Telehealth: Payer: Self-pay

## 2023-11-03 ENCOUNTER — Ambulatory Visit (INDEPENDENT_AMBULATORY_CARE_PROVIDER_SITE_OTHER)

## 2023-11-03 ENCOUNTER — Encounter: Payer: Self-pay | Admitting: Allergy

## 2023-11-03 DIAGNOSIS — J309 Allergic rhinitis, unspecified: Secondary | ICD-10-CM

## 2023-11-03 MED ORDER — EPINEPHRINE 0.3 MG/0.3ML IJ SOAJ: 0.3000 mg | INTRAMUSCULAR | 1 refills | Status: AC | PRN

## 2023-11-03 NOTE — Telephone Encounter (Signed)
 Patient came I today and started his allergy  injections. Patient expressed that he will need a letter stating that he is on allergy  injections and the he's coming twice a week to get them. Please advise on what the letter should say. Thank you!

## 2023-11-03 NOTE — Progress Notes (Signed)
 Immunotherapy   Patient Details  Name: Christopher Frey MRN: [id number] Date of Birth: 03-20-1991  11/03/2023  Irvin J Grahn started injections for  grasses, weeds, trees, ragweed's, molds, and dust mites. Following schedule: B  Frequency:2 times per week Epi-Pen:Prescription for Epi-Pen given Consent signed in office today and patient instructions given. Patient sat in the lobby for thirty minutes without an issue.  Brutus Caprice 11/03/2023, 3:31 PM

## 2023-11-04 ENCOUNTER — Telehealth: Payer: Self-pay

## 2023-11-04 NOTE — Telephone Encounter (Signed)
 Patient called and expressed that he wanted to switch his vials to [place] as he works in [place]. Vial swill packed up tomorrow in [place] and sent to [facility] for him to get his second injection on Thursday.

## 2023-11-05 NOTE — Telephone Encounter (Signed)
 Vials have been packed up and ready to be transported to [facility].

## 2023-11-06 ENCOUNTER — Ambulatory Visit (INDEPENDENT_AMBULATORY_CARE_PROVIDER_SITE_OTHER)

## 2023-11-06 DIAGNOSIS — J309 Allergic rhinitis, unspecified: Secondary | ICD-10-CM | POA: Diagnosis not present

## 2023-11-07 NOTE — Telephone Encounter (Signed)
 I called the patient and informed letter was ready. I verified mailing address and letter has been placed to be mailed out.

## 2023-11-07 NOTE — Telephone Encounter (Signed)
Wrote the letter

## 2023-11-27 ENCOUNTER — Ambulatory Visit

## 2023-11-27 DIAGNOSIS — J309 Allergic rhinitis, unspecified: Secondary | ICD-10-CM

## 2023-12-03 ENCOUNTER — Ambulatory Visit (INDEPENDENT_AMBULATORY_CARE_PROVIDER_SITE_OTHER)

## 2023-12-03 DIAGNOSIS — J309 Allergic rhinitis, unspecified: Secondary | ICD-10-CM | POA: Diagnosis not present

## 2023-12-10 ENCOUNTER — Ambulatory Visit (INDEPENDENT_AMBULATORY_CARE_PROVIDER_SITE_OTHER)

## 2023-12-10 DIAGNOSIS — J309 Allergic rhinitis, unspecified: Secondary | ICD-10-CM | POA: Diagnosis not present

## 2023-12-24 ENCOUNTER — Ambulatory Visit (INDEPENDENT_AMBULATORY_CARE_PROVIDER_SITE_OTHER)

## 2023-12-24 DIAGNOSIS — J309 Allergic rhinitis, unspecified: Secondary | ICD-10-CM

## 2024-01-05 ENCOUNTER — Ambulatory Visit: Admitting: Allergy & Immunology

## 2024-01-07 ENCOUNTER — Ambulatory Visit (INDEPENDENT_AMBULATORY_CARE_PROVIDER_SITE_OTHER)

## 2024-01-07 DIAGNOSIS — J309 Allergic rhinitis, unspecified: Secondary | ICD-10-CM | POA: Diagnosis not present

## 2024-01-09 ENCOUNTER — Ambulatory Visit (INDEPENDENT_AMBULATORY_CARE_PROVIDER_SITE_OTHER): Payer: Self-pay

## 2024-01-09 DIAGNOSIS — J309 Allergic rhinitis, unspecified: Secondary | ICD-10-CM | POA: Diagnosis not present

## 2024-01-14 ENCOUNTER — Ambulatory Visit (INDEPENDENT_AMBULATORY_CARE_PROVIDER_SITE_OTHER): Payer: Self-pay

## 2024-01-14 DIAGNOSIS — J309 Allergic rhinitis, unspecified: Secondary | ICD-10-CM

## 2024-01-16 ENCOUNTER — Ambulatory Visit: Admitting: Family Medicine

## 2024-01-16 ENCOUNTER — Other Ambulatory Visit: Payer: Self-pay

## 2024-01-16 ENCOUNTER — Encounter: Payer: Self-pay | Admitting: Family Medicine

## 2024-01-16 VITALS — BP 132/88 | HR 105 | Temp 98.8°F | Resp 18 | Ht 74.65 in | Wt 161.8 lb

## 2024-01-16 DIAGNOSIS — J302 Other seasonal allergic rhinitis: Secondary | ICD-10-CM | POA: Insufficient documentation

## 2024-01-16 DIAGNOSIS — J3089 Other allergic rhinitis: Secondary | ICD-10-CM

## 2024-01-16 DIAGNOSIS — K219 Gastro-esophageal reflux disease without esophagitis: Secondary | ICD-10-CM | POA: Diagnosis not present

## 2024-01-16 DIAGNOSIS — J4520 Mild intermittent asthma, uncomplicated: Secondary | ICD-10-CM | POA: Diagnosis not present

## 2024-01-16 MED ORDER — MONTELUKAST SODIUM 10 MG PO TABS: 10.0000 mg | ORAL_TABLET | Freq: Every day | ORAL | 5 refills | Status: AC

## 2024-01-16 MED ORDER — RYALTRIS 665-25 MCG/ACT NA SUSP: 2.0000 | Freq: Two times a day (BID) | NASAL | 5 refills | Status: AC | PRN

## 2024-01-16 NOTE — Progress Notes (Signed)
 [address] [place] [address] Dept: [phone]  FOLLOW UP NOTE  Patient ID: Christopher Frey, male    DOB: 04-22-1991  Age: 33 y.o. MRN: [id number] Date of Office Visit: 01/16/2024  Assessment  Chief Complaint: Allergic Rhinitis  (Pt reports no complaints.)  HPI Christopher Frey is a 33 year old male who presents to the clinic for a follow-up visit.  He was last seen in this clinic on 10/03/2023 by Arlean Mutter, FNP, for evaluation of asthma, allergic rhinitis, on allergen immunotherapy and reflux.  At today's visit, he reports that his asthma has been well controlled with no shortness of breath, cough, or wheeze with activity or rest. He rarely uses AirSupra for relief of asthma symptoms.   Allergic rhinitis is reported as well-controlled at this time with no symptoms including rhinorrhea, nasal congestion, sneezing, or postnasal drainage.  He continues Allegra daily and reports that he did not get the montelukast  from the pharmacy at the last visit.  He is not currently using a nasal spray as he reports he did not get the Ryaltris  at the last visit.  Chart notes indicate the montelukast  and rafters were sent to their respective pharmacies.  He is not currently using saline nasal rinses. His last environmental allergy  skin testing was on 07/30/2023 and was positive to ragweed pollen, tree pollen, weed pollen, grass, mold, dust mite, and cockroach.  He continues allergen immunotherapy with no large or local reactions.  He began allergen immunotherapy on 11/03/2023.  EpiPen  set is up-to-date.  Reflux is reported as moderately well-controlled with occasional heartburn after eating food that is known to aggravate reflux.  He continues pantoprazole  40 mg twice a day and continues to follow-up with his GI specialist as recommended.  His current medications are listed in the chart.  Drug Allergies:  No Known Allergies  Physical Exam: BP 132/88 (BP Location: Left Arm, Patient Position:  Sitting, Cuff Size: Normal)   Pulse (!) 105   Temp 98.8 F (37.1 C) (Temporal)   Resp 18   Ht 6' 2.65 (1.896 m)   Wt 161 lb 12.8 oz (73.4 kg)   SpO2 98%   BMI 20.42 kg/m    Physical Exam Vitals reviewed.  Constitutional:      Appearance: Normal appearance.  HENT:     Head: Normocephalic and atraumatic.     Right Ear: Tympanic membrane normal.     Left Ear: Tympanic membrane normal.     Nose:     Comments: Bilateral nares normal.  Pharynx normal.  Ears normal.  Eyes normal.    Mouth/Throat:     Pharynx: Oropharynx is clear.  Eyes:     Conjunctiva/sclera: Conjunctivae normal.  Cardiovascular:     Rate and Rhythm: Normal rate and regular rhythm.     Heart sounds: Normal heart sounds. No murmur heard. Pulmonary:     Effort: Pulmonary effort is normal.     Breath sounds: Normal breath sounds.     Comments: Lungs clear to auscultation Musculoskeletal:        General: Normal range of motion.     Cervical back: Normal range of motion and neck supple.  Skin:    General: Skin is warm and dry.  Neurological:     Mental Status: He is alert and oriented to person, place, and time.  Psychiatric:        Mood and Affect: Mood normal.        Behavior: Behavior normal.  Thought Content: Thought content normal.        Judgment: Judgment normal.     Assessment and Plan: 1. Mild intermittent asthma without complication   2. Seasonal and perennial allergic rhinitis   3. Gastroesophageal reflux disease, unspecified whether esophagitis present     Meds ordered this encounter  Medications   montelukast  (SINGULAIR ) 10 MG tablet    Sig: Take 1 tablet (10 mg total) by mouth at bedtime.    Dispense:  30 tablet    Refill:  5   Olopatadine-Mometasone (RYALTRIS ) 665-25 MCG/ACT SUSP    Sig: Place 2 sprays into the nose 2 (two) times daily as needed.    Dispense:  29 g    Refill:  5    Patient Instructions  Asthma Continue Airsupra 2 puffs as needed.  Do not use more than 12  puffs in a 24-hour time span  Allergic rhinitis Continue montelukast  10 mg once a day to control allergy  symptoms Continue allergen avoidance measures directed toward grass pollen, weed pollen, ragweed pollen, tree pollen, mold, dust mite, and cockroach as listed below Continue Ryaltris  2 sprays in each nostril up to twice a day if needed for nasal symptoms Continue an Allegra 180 mg once a day if needed for a runny nose. You may take an additional dose of antihistamine once a day if needed. Remember to rotate to a different antihistamine about every 3 months. Some examples of over the counter antihistamines include Zyrtec  (cetirizine ), Xyzal (levocetirizine), Allegra (fexofenadine), and Claritin (loratidine).  Continue Atrovent  2 sprays in each nostril up to 2 times a day if needed for runny nose Consider saline nasal rinses as needed for nasal symptoms. Use this before any medicated nasal sprays for best result Continue allergen immunotherapy and have access to an EpiPen  set  Reflux Continue dietary lifestyle modifications as listed below Continue to follow-up with your GI provider as recommended Continue Protonix  40 mg twice a day to control reflux  Call the clinic if this treatment plan is not working well for you.  Follow up in 6 months months or sooner if needed.  Return in about 6 months (around 07/18/2024), or if symptoms worsen or fail to improve.    Thank you for the opportunity to care for this patient.  Please do not hesitate to contact me with questions.  Arlean Mutter, FNP [facility] 

## 2024-01-16 NOTE — Patient Instructions (Addendum)
 Asthma Continue Airsupra 2 puffs as needed.  Do not use more than 12 puffs in a 24-hour time span  Allergic rhinitis Continue montelukast  10 mg once a day to control allergy  symptoms Continue allergen avoidance measures directed toward grass pollen, weed pollen, ragweed pollen, tree pollen, mold, dust mite, and cockroach as listed below Continue Ryaltris  2 sprays in each nostril up to twice a day if needed for nasal symptoms Continue an Allegra 180 mg once a day if needed for a runny nose. You may take an additional dose of antihistamine once a day if needed. Remember to rotate to a different antihistamine about every 3 months. Some examples of over the counter antihistamines include Zyrtec  (cetirizine ), Xyzal (levocetirizine), Allegra (fexofenadine), and Claritin (loratidine).  Continue Atrovent  2 sprays in each nostril up to 2 times a day if needed for runny nose Consider saline nasal rinses as needed for nasal symptoms. Use this before any medicated nasal sprays for best result Continue allergen immunotherapy and have access to an EpiPen  set  Reflux Continue dietary lifestyle modifications as listed below Continue to follow-up with your GI provider as recommended Continue Protonix  40 mg twice a day to control reflux  Call the clinic if this treatment plan is not working well for you.  Follow up in 6 months months or sooner if needed.  Reducing Pollen Exposure The American Academy of Allergy , Asthma and Immunology suggests the following steps to reduce your exposure to pollen during allergy  seasons. Do not hang sheets or clothing out to dry; pollen may collect on these items. Do not mow lawns or spend time around freshly cut grass; mowing stirs up pollen. Keep windows closed at night.  Keep car windows closed while driving. Minimize morning activities outdoors, a time when pollen counts are usually at their highest. Stay indoors as much as possible when pollen counts or humidity is high  and on windy days when pollen tends to remain in the air longer. Use air conditioning when possible.  Many air conditioners have filters that trap the pollen spores. Use a HEPA room air filter to remove pollen form the indoor air you breathe.  Control of Mold Allergen Mold and fungi can grow on a variety of surfaces provided certain temperature and moisture conditions exist.  Outdoor molds grow on plants, decaying vegetation and soil.  The major outdoor mold, Alternaria and Cladosporium, are found in very high numbers during hot and dry conditions.  Generally, a late Summer - Fall peak is seen for common outdoor fungal spores.  Rain will temporarily lower outdoor mold spore count, but counts rise rapidly when the rainy period ends.  The most important indoor molds are Aspergillus and Penicillium.  Dark, humid and poorly ventilated basements are ideal sites for mold growth.  The next most common sites of mold growth are the bathroom and the kitchen.  Outdoor [employer] Use air conditioning and keep windows closed Avoid exposure to decaying vegetation. Avoid leaf raking. Avoid grain handling. Consider wearing a face mask if working in moldy areas.  Indoor Mold Control Maintain humidity below 50%. Clean washable surfaces with 5% bleach solution. Remove sources e.g. Contaminated carpets.   Control of Dust Mite Allergen Dust mites play a major role in allergic asthma and rhinitis. They occur in environments with high humidity wherever human skin is found. Dust mites absorb humidity from the atmosphere (ie, they do not drink) and feed on organic matter (including shed human and animal skin). Dust mites are a microscopic  type of insect that you cannot see with the naked eye. High levels of dust mites have been detected from mattresses, pillows, carpets, upholstered furniture, bed covers, clothes, soft toys and any woven material. The principal allergen of the dust mite is found in its feces. A gram  of dust may contain 1,000 mites and 250,000 fecal particles. Mite antigen is easily measured in the air during house cleaning activities. Dust mites do not bite and do not cause harm to humans, other than by triggering allergies/asthma.  Ways to decrease your exposure to dust mites in your home:  1. Encase mattresses, box springs and pillows with a mite-impermeable barrier or cover  2. Wash sheets, blankets and drapes weekly in hot water (130 F) with detergent and dry them in a dryer on the hot setting.  3. Have the room cleaned frequently with a vacuum cleaner and a damp dust-mop. For carpeting or rugs, vacuuming with a vacuum cleaner equipped with a high-efficiency particulate air (HEPA) filter. The dust mite allergic individual should not be in a room which is being cleaned and should wait 1 hour after cleaning before going into the room.  4. Do not sleep on upholstered furniture (eg, couches).  5. If possible removing carpeting, upholstered furniture and drapery from the home is ideal. Horizontal blinds should be eliminated in the rooms where the person spends the most time (bedroom, study, television room). Washable vinyl, roller-type shades are optimal.  6. Remove all non-washable stuffed toys from the bedroom. Wash stuffed toys weekly like sheets and blankets above.  7. Reduce indoor humidity to less than 50%. Inexpensive humidity monitors can be purchased at most hardware stores. Do not use a humidifier as can make the problem worse and are not recommended.  Control of Cockroach Allergen Cockroach allergen has been identified as an important cause of acute attacks of asthma, especially in urban settings.  There are fifty-five species of cockroach that exist in the [place] , however only three, the [place], [place] and [place] species produce allergen that can affect patients with Asthma.  Allergens can be obtained from fecal particles, egg casings and secretions from cockroaches.     Remove food sources. Reduce access to water. Seal access and entry points. Spray runways with 0.5-1% Diazinon or Chlorpyrifos Blow boric acid power under stoves and refrigerator. Place bait stations (hydramethylnon) at feeding sites.

## 2024-01-21 ENCOUNTER — Ambulatory Visit (INDEPENDENT_AMBULATORY_CARE_PROVIDER_SITE_OTHER)

## 2024-01-21 DIAGNOSIS — J309 Allergic rhinitis, unspecified: Secondary | ICD-10-CM | POA: Diagnosis not present

## 2024-01-23 ENCOUNTER — Ambulatory Visit (INDEPENDENT_AMBULATORY_CARE_PROVIDER_SITE_OTHER)

## 2024-01-23 DIAGNOSIS — J309 Allergic rhinitis, unspecified: Secondary | ICD-10-CM | POA: Diagnosis not present

## 2024-01-28 ENCOUNTER — Ambulatory Visit (INDEPENDENT_AMBULATORY_CARE_PROVIDER_SITE_OTHER)

## 2024-01-28 DIAGNOSIS — J309 Allergic rhinitis, unspecified: Secondary | ICD-10-CM | POA: Diagnosis not present

## 2024-02-06 ENCOUNTER — Ambulatory Visit (INDEPENDENT_AMBULATORY_CARE_PROVIDER_SITE_OTHER)

## 2024-02-06 DIAGNOSIS — J309 Allergic rhinitis, unspecified: Secondary | ICD-10-CM | POA: Diagnosis not present

## 2024-02-11 ENCOUNTER — Ambulatory Visit (INDEPENDENT_AMBULATORY_CARE_PROVIDER_SITE_OTHER)

## 2024-02-11 DIAGNOSIS — J309 Allergic rhinitis, unspecified: Secondary | ICD-10-CM | POA: Diagnosis not present

## 2024-02-25 ENCOUNTER — Ambulatory Visit (INDEPENDENT_AMBULATORY_CARE_PROVIDER_SITE_OTHER)

## 2024-02-25 DIAGNOSIS — J309 Allergic rhinitis, unspecified: Secondary | ICD-10-CM

## 2024-02-27 ENCOUNTER — Ambulatory Visit (INDEPENDENT_AMBULATORY_CARE_PROVIDER_SITE_OTHER): Payer: Self-pay

## 2024-02-27 DIAGNOSIS — J309 Allergic rhinitis, unspecified: Secondary | ICD-10-CM

## 2024-03-03 ENCOUNTER — Ambulatory Visit (INDEPENDENT_AMBULATORY_CARE_PROVIDER_SITE_OTHER)

## 2024-03-03 DIAGNOSIS — J309 Allergic rhinitis, unspecified: Secondary | ICD-10-CM

## 2024-03-05 ENCOUNTER — Ambulatory Visit (INDEPENDENT_AMBULATORY_CARE_PROVIDER_SITE_OTHER)

## 2024-03-05 DIAGNOSIS — J309 Allergic rhinitis, unspecified: Secondary | ICD-10-CM | POA: Diagnosis not present

## 2024-03-11 NOTE — Therapy (Signed)
 OUTPATIENT PHYSICAL THERAPY THORACOLUMBAR EVALUATION   Patient Name: Christopher Frey MRN: [id number] DOB:Dec 08, 1990, 33 y.o., male Today's Date: 03/11/2024  END OF SESSION:   Past Medical History:  Diagnosis Date   Acid reflux    Past Surgical History:  Procedure Laterality Date   BIOPSY  05/08/2021   Procedure: BIOPSY;  Surgeon: Cindie Carlin POUR, DO;  Location: [facility];  Service: Endoscopy;;   ESOPHAGOGASTRODUODENOSCOPY (EGD) WITH PROPOFOL  N/A 05/08/2021   Procedure: ESOPHAGOGASTRODUODENOSCOPY (EGD) WITH PROPOFOL ;  Surgeon: Cindie Carlin POUR, DO;  Location: [facility];  Service: Endoscopy;  Laterality: N/A;  3:00pm   SHOULDER SURGERY     Patient Active Problem List   Diagnosis Date Noted   Mild intermittent asthma without complication 01/16/2024   Seasonal and perennial allergic rhinitis 01/16/2024   Gastroesophageal reflux disease 01/16/2024   Difficulty walking 06/05/2012   Stiffness of joints, not elsewhere classified, multiple sites 06/05/2012    PCP: Shona Norleen PEDLAR, MD   REFERRING PROVIDER: Louis Shove, MD  REFERRING DIAG: back pain  Rationale for Evaluation and Treatment: Rehabilitation  THERAPY DIAG:  No diagnosis found.  ONSET DATE: ***  SUBJECTIVE:                                                                                                                                                                                           SUBJECTIVE STATEMENT: ***  PERTINENT HISTORY:  ***  PAIN:  Are you having pain? {OPRCPAIN:27236}  PRECAUTIONS: {Therapy precautions:24002}  RED FLAGS: {PT Red Flags:29287}   WEIGHT BEARING RESTRICTIONS: {Yes ***/No:24003}  FALLS:  Has patient fallen in last 6 months? {fallsyesno:27318}  LIVING ENVIRONMENT: Lives with: {OPRC lives with:25569::lives with their family} Lives in: {Lives in:25570} Stairs: {opstairs:27293} Has following equipment at home: {Assistive devices:23999}  OCCUPATION: ***  PLOF:  {PLOF:24004}  PATIENT GOALS: ***  NEXT MD VISIT: ***  OBJECTIVE:  Note: Objective measures were completed at Evaluation unless otherwise noted.  DIAGNOSTIC FINDINGS:  CLINICAL DATA:  Low back pain   EXAM: MRI LUMBAR SPINE WITHOUT CONTRAST   TECHNIQUE: Multiplanar, multisequence MR imaging of the lumbar spine was performed. No intravenous contrast was administered.   COMPARISON:  08/01/2012 MRI report, 02/07/2023 CT scan with images   FINDINGS: Segmentation:  Standard.   Alignment:  Physiologic.   Vertebrae: No fracture, evidence of discitis, or bone lesion. 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   Conus medullaris and cauda equina: Conus extends to the L1 level. Conus and cauda equina appear normal.   Paraspinal and other soft tissues: Negative.   Disc levels:   T12-L1: Unremarkable.   L1-L2: Shallow  left paracentral disc protrusion without foraminal or canal stenosis.   L2-L3: Shallow disc protrusion in the left subarticular zone. No foraminal or canal stenosis.   L3-L4: Unremarkable.   L4-L5: Shallow central disc protrusion. No foraminal or canal stenosis.   L5-S1: Disc protrusion in the left subarticular zone results in left subarticular recess narrowing with impingement of the descending left S1 nerve root. No canal or foraminal stenosis.   IMPRESSION: 1. Disc protrusion in the left subarticular zone at L5-S1 results in left subarticular recess narrowing with impingement of the descending left S1 nerve root. 2. No canal or foraminal stenosis at any level.  PATIENT SURVEYS:  Modified Oswestry: {:PHR,OPRCODI}  COGNITION: Overall cognitive status: {cognition:24006}     SENSATION: {sensation:27233}  MUSCLE LENGTH: Hamstrings: Right *** deg; Left *** deg Debby test: Right *** deg; Left *** deg  POSTURE: {posture:25561}  PALPATION: ***  LUMBAR ROM:   AROM eval  Flexion   Extension   Right lateral flexion   Left  lateral flexion   Right rotation   Left rotation    (Blank rows = not tested)  LOWER EXTREMITY ROM:     {AROM/PROM:27142}  Right eval Left eval  Hip flexion    Hip extension    Hip abduction    Hip adduction    Hip internal rotation    Hip external rotation    Knee flexion    Knee extension    Ankle dorsiflexion    Ankle plantarflexion    Ankle inversion    Ankle eversion     (Blank rows = not tested)  LOWER EXTREMITY MMT:    MMT Right eval Left eval  Hip flexion    Hip extension    Hip abduction    Hip adduction    Hip internal rotation    Hip external rotation    Knee flexion    Knee extension    Ankle dorsiflexion    Ankle plantarflexion    Ankle inversion    Ankle eversion     (Blank rows = not tested)  LUMBAR SPECIAL TESTS:  {lumbar special test:25242}  FUNCTIONAL TESTS:  5 times sit to stand: *** 2 minute walk test: ***  GAIT: Distance walked: *** Assistive device utilized: {Assistive devices:23999} Level of assistance: {Levels of assistance:24026} Comments: ***  TREATMENT DATE:  03/11/2024  Vitals: BP: HR: O2 sat: Evaluation: -ROM measured, Strength assessed, HEP prescribed, pt educated on prognosis, findings, and importance of HEP compliance if given.  Manual Therapy: -CPA of Lumbar Spinal segments L3-L5, grade II-III mobilizations -STM of Lumbar Paraspinal musculature  Therapeutic Exercise: -Supine bridges 2 sets of 10 reps, 3 second holds, symptomatic, pt cued for max hip extension -Standing 3 way hip 1 sets 10 reps, bilaterally, pt cued for upright trunk and maintaining of neutral spine -Lateral stepping 3 laps 20 feet per lap, second 2 with RTB around ankles, pt cued for upright posture -Forward lunges, 1 set of 5 reps better performance going into RLE, pt cued for core activation and upright posture  PATIENT EDUCATION:  Education details: Pt was educated on findings of PT evaluation, prognosis, frequency of therapy visits and rationale, attendance policy, and HEP if given.   Person educated: {Person educated:25204} Education method: {Education Method:25205} Education comprehension: {Education Comprehension:25206}  HOME EXERCISE PROGRAM: ***  ASSESSMENT:  CLINICAL IMPRESSION: Patient is a 33 y.o. male who was seen today for physical therapy evaluation and treatment for back pain.   Patient demonstrates decreased LE strength, abnormal pain rating, and impaired balance. Patient also demonstrates difficulty with ambulation during today's session with decreased stride length and velocity noted. Patient also demonstrates ***. Patient requires ***. Patient would benefit from skilled physical therapy for increased endurance with ambulation, increased LE strength, and balance for improved gait quality, return to higher level of function with ADLs, and progress towards therapy goals.   OBJECTIVE IMPAIRMENTS: {opptimpairments:25111}.   ACTIVITY LIMITATIONS: {activitylimitations:27494}  PARTICIPATION LIMITATIONS: {participationrestrictions:25113}  PERSONAL FACTORS: {Personal factors:25162} are also affecting patient's functional outcome.   REHAB POTENTIAL: {rehabpotential:25112}  CLINICAL DECISION MAKING: {clinical decision making:25114}  EVALUATION COMPLEXITY: {Evaluation complexity:25115}   GOALS: Goals reviewed with patient? {yes/no:20286}  SHORT TERM GOALS: Target date: ***  Pt will be independent with HEP in order to demonstrate participation in Physical Therapy POC.  Baseline: Goal status: {GOALSTATUS:25110}  2.  Pt will report ***/10 pain with mobility in order to demonstrate improved pain with ADLs.  Baseline:  Goal status: {GOALSTATUS:25110}  LONG TERM GOALS: Target date: ***  Pt will improve *** by *** in order to demonstrate improved functional strength to  return to desired activities.  Baseline: see objective.  Goal status: {GOALSTATUS:25110}  2.  Pt will improve 2 MWT by *** in order to demonstrate improved functional ambulatory capacity in community setting.  Baseline: see objective.  Goal status: {GOALSTATUS:25110}  3.  Pt will improve Modified Oswestry score by *** in order to demonstrate improved pain with functional goals and outcomes. Baseline: see objective.  Goal status: {GOALSTATUS:25110}  4.  Pt will report ***/10 pain with mobility in order to demonstrate reduced pain with ADLs lasting greater than 30 minutes.  Baseline: see objective.  Goal status: {GOALSTATUS:25110}   PLAN:  PT FREQUENCY: {rehab frequency:25116}  PT DURATION: {rehab duration:25117}  PLANNED INTERVENTIONS: {rehab planned interventions:25118::97110-Therapeutic exercises,97530- Therapeutic [phone]- Neuromuscular re-education,97535- Self Rjmz,02859- Manual therapy,Patient/Family education}.  PLAN FOR NEXT SESSION: ***   Lang Ada, PT, DPT [facility] Office: [phone] 2:09 PM, 03/11/24

## 2024-03-12 ENCOUNTER — Ambulatory Visit (HOSPITAL_COMMUNITY): Attending: Neurosurgery

## 2024-03-12 ENCOUNTER — Other Ambulatory Visit: Payer: Self-pay

## 2024-03-12 ENCOUNTER — Encounter (HOSPITAL_COMMUNITY): Payer: Self-pay

## 2024-03-12 ENCOUNTER — Ambulatory Visit (INDEPENDENT_AMBULATORY_CARE_PROVIDER_SITE_OTHER): Payer: Self-pay

## 2024-03-12 DIAGNOSIS — M5459 Other low back pain: Secondary | ICD-10-CM | POA: Diagnosis present

## 2024-03-12 DIAGNOSIS — Z7409 Other reduced mobility: Secondary | ICD-10-CM | POA: Diagnosis present

## 2024-03-12 DIAGNOSIS — J309 Allergic rhinitis, unspecified: Secondary | ICD-10-CM | POA: Diagnosis not present

## 2024-03-12 DIAGNOSIS — M5386 Other specified dorsopathies, lumbar region: Secondary | ICD-10-CM | POA: Diagnosis present

## 2024-03-12 NOTE — Addendum Note (Signed)
 Addended by: Benjermin Korber on: 03/12/2024 12:50 PM   Modules accepted: Orders

## 2024-03-17 ENCOUNTER — Telehealth: Payer: Self-pay

## 2024-03-17 NOTE — Telephone Encounter (Signed)
 Patient called in stating that he has returned to work in [place]. He stated that he would like to start getting his injections at the [facility] location. I informed him that he needed to call the [place] office to inform them that he would lie his vials transferred. He stated that he did but was directed to the [facility] injection room. Please have the vials transferred to the [facility] location. He will be coming tomorrow for his injections

## 2024-03-18 NOTE — Telephone Encounter (Signed)
 Vials will be transported to [facility] next week.

## 2024-03-19 NOTE — Telephone Encounter (Signed)
 Yep I will take care of it.

## 2024-03-23 ENCOUNTER — Ambulatory Visit (HOSPITAL_COMMUNITY)

## 2024-03-23 ENCOUNTER — Ambulatory Visit

## 2024-03-23 DIAGNOSIS — M5459 Other low back pain: Secondary | ICD-10-CM | POA: Diagnosis not present

## 2024-03-23 DIAGNOSIS — Z7409 Other reduced mobility: Secondary | ICD-10-CM

## 2024-03-23 DIAGNOSIS — M5386 Other specified dorsopathies, lumbar region: Secondary | ICD-10-CM

## 2024-03-23 DIAGNOSIS — J309 Allergic rhinitis, unspecified: Secondary | ICD-10-CM

## 2024-03-23 NOTE — Therapy (Signed)
 OUTPATIENT PHYSICAL THERAPY THORACOLUMBAR TREATMENT   Patient Name: Christopher Frey MRN: [id number] DOB:04/25/1991, 33 y.o., male Today's Date: 03/23/2024  END OF SESSION:  PT End of Session - 03/23/24 0735     Visit Number 2    Number of Visits 12    Date for Recertification  04/23/24    Authorization Type [employer] OTHER    Authorization Time Period seeking auth    Progress Note Due on Visit 10    PT Start Time 0734    PT Stop Time 0814    PT Time Calculation (min) 40 min    Activity Tolerance Patient tolerated treatment well;Patient limited by pain    Behavior During Therapy [facility] for tasks assessed/performed           Past Medical History:  Diagnosis Date   Acid reflux    Past Surgical History:  Procedure Laterality Date   BIOPSY  05/08/2021   Procedure: BIOPSY;  Surgeon: Cindie Carlin POUR, DO;  Location: [facility];  Service: Endoscopy;;   ESOPHAGOGASTRODUODENOSCOPY (EGD) WITH PROPOFOL  N/A 05/08/2021   Procedure: ESOPHAGOGASTRODUODENOSCOPY (EGD) WITH PROPOFOL ;  Surgeon: Cindie Carlin POUR, DO;  Location: [facility];  Service: Endoscopy;  Laterality: N/A;  3:00pm   SHOULDER SURGERY     Patient Active Problem List   Diagnosis Date Noted   Mild intermittent asthma without complication 01/16/2024   Seasonal and perennial allergic rhinitis 01/16/2024   Gastroesophageal reflux disease 01/16/2024   Difficulty walking 06/05/2012   Stiffness of joints, not elsewhere classified, multiple sites 06/05/2012    PCP: Shona Norleen PEDLAR, MD   REFERRING PROVIDER: Louis Shove, MD  REFERRING DIAG: back pain  Rationale for Evaluation and Treatment: Rehabilitation  THERAPY DIAG:  Other low back pain  Impaired functional mobility and activity tolerance  Decreased range of motion of lumbar spine  ONSET DATE: Been going on for years now.   SUBJECTIVE:                                                                                                                                                                                            SUBJECTIVE STATEMENT: Tightness in low back this morning; has returned to work.  Reports some cramping in left calf. Reports he is compliant with HEP.   EVAL: Pt states his back pain has been going on for years no MOI recalled. Pt states his back has been feeling really tight and stiff following surgery. Pt states he is going back to work Monday, has been out of work since June the 16th but has been doing some Chiropodist work. 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 Pt states he went to chiropractor before surgery but has not been back. Pt states latest MRI revealed scar tissue around surgical area and some disc protrusion at L3-L4 but doc said he is not too concern about it and cleared him for work. Pt states he did not have any peripheral symptoms in LLE until a month after surgery, LLE reported as weaker and tingling feeling 2-3 times per day. Pt is very eager to return to full mobility of low back for work and to return to golf etc.   PERTINENT HISTORY:  L5-S1, surgery June 16th, had steroid shot few days ago (Wednesday 03/10/24)  PAIN:  Are you having pain? Yes: NPRS scale: 6/10 Pain location: mid low back Pain description: stiffness Aggravating factors: stooping, bending, sitting and standing Relieving factors: walking, ice, heat, pain meds  PRECAUTIONS: None  RED FLAGS: None   WEIGHT BEARING RESTRICTIONS: No  FALLS:  Has patient fallen in last 6 months? No   OCCUPATION: Maintenance for [employer]  PLOF: Independent and Independent with basic ADLs  PATIENT GOALS: decreased the low back stiffness, be able to stand and sit longer, see progression  NEXT MD VISIT: December  OBJECTIVE:  Note: Objective measures were completed at Evaluation unless otherwise noted.  DIAGNOSTIC FINDINGS:  CLINICAL DATA:  Low back pain   EXAM: MRI LUMBAR SPINE WITHOUT CONTRAST   TECHNIQUE: Multiplanar,  multisequence MR imaging of the lumbar spine was performed. No intravenous contrast was administered.   COMPARISON:  08/01/2012 MRI report, 02/07/2023 CT scan with images   FINDINGS: Segmentation:  Standard.   Alignment:  Physiologic.   Vertebrae: No fracture, evidence of discitis, or bone lesion. 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   Conus medullaris and cauda equina: Conus extends to the L1 level. Conus and cauda equina appear normal.   Paraspinal and other soft tissues: Negative.   Disc levels:   T12-L1: Unremarkable.   L1-L2: Shallow left paracentral disc protrusion without foraminal or canal stenosis.   L2-L3: Shallow disc protrusion in the left subarticular zone. No foraminal or canal stenosis.   L3-L4: Unremarkable.   L4-L5: Shallow central disc protrusion. No foraminal or canal stenosis.   L5-S1: Disc protrusion in the left subarticular zone results in left subarticular recess narrowing with impingement of the descending left S1 nerve root. No canal or foraminal stenosis.   IMPRESSION: 1. Disc protrusion in the left subarticular zone at L5-S1 results in left subarticular recess narrowing with impingement of the descending left S1 nerve root. 2. No canal or foraminal stenosis at any level.  PATIENT SURVEYS:  Modified Oswestry: 21 / 50 = 42.0 %  COGNITION: Overall cognitive status: Within functional limits for tasks assessed     SENSATION: Light touch: Impaired , LLE from butt cheek to feet, couple times per day  PALPATION: Pt demonstrates increased tenderness and tightness of bilateral lumbar paraspinals and CPA and UPA mobilizations of lumbar spine, L1-L5  LUMBAR ROM:   AROM eval  Flexion 25, pain  Extension 5, worse pain  Right lateral flexion 15, tightness  Left lateral flexion 10, nothing  Right rotation 75% available, tightness  Left rotation WFL   (Blank rows = not tested)  LOWER EXTREMITY ROM:     Active   Right eval Left eval  Hip flexion    Hip extension    Hip abduction    Hip adduction    Hip internal rotation    Hip external rotation  Knee flexion    Knee extension    Ankle dorsiflexion    Ankle plantarflexion    Ankle inversion    Ankle eversion     (Blank rows = not tested)  LOWER EXTREMITY MMT:    MMT Right eval Left eval  Hip flexion 4- 3+  Hip extension 4 4  Hip abduction 4 4-  Hip adduction    Hip internal rotation    Hip external rotation    Knee flexion    Knee extension    Ankle dorsiflexion    Ankle plantarflexion    Ankle inversion    Ankle eversion     (Blank rows = not tested)  LUMBAR SPECIAL TESTS:  Straight leg raise test: Positive, RLE positive for back pain  FUNCTIONAL TESTS:  5 times sit to stand: TBA14.37 sec 2 minute walk test: TBA 468 ft   SLS: TBA 60 each but needs more effort left side GAIT: Distance walked: 80 feet to and from treatment area Assistive device utilized: None Level of assistance: Complete Independence Comments: pt demonstrates close to normal gait pattern, initiation of gait is slow and cautious  TREATMENT DATE:  03/23/24 5 times sit to stand 14.37 sec no UE assist 2 MWT 468 ft no AD SLS 60 each side; more difficulty left side Standing calf stretch  5 x 20 Soleus stretch 5 x 20 Supine: LTR 10 x 10 Active hamstring stretch 5 x 20 McGill 3 Abdominal curl 3 hold x 10 each Side plank 10 x 5 Bird dog x 10 each side Grade 2 mobs lumbar spine x 10 Instruction in use of tennis ball for soft tissue compression; release muscle knots Seated hamstring stretch 5 x 20    03/11/2024   Evaluation: -ROM measured, Strength assessed, HEP prescribed, pt educated on prognosis, findings, and importance of HEP compliance if given.                                                                                                                                  PATIENT EDUCATION:  Education details: Pt was educated  on findings of PT evaluation, prognosis, frequency of therapy visits and rationale, attendance policy, and HEP if given.   Person educated: Patient Education method: Explanation, Verbal cues, and Handouts Education comprehension: verbalized understanding, verbal cues required, and needs further education  HOME EXERCISE PROGRAM: Access Code: [email or web address] URL: [email or web address] Date: 03/12/2024 Prepared by: Lang Ada  Exercises - Supine Lower Trunk Rotation  - 1 x daily - 7 x weekly - 3 sets - 10 reps - Bird Dog  - 1 x daily - 7 x weekly - 3 sets - 10 reps - Side Plank on Elbow  - 1 x daily - 7 x weekly - 3 sets - 10 reps - Neutral Curl Up with Straight Leg  - 1 x daily - 7 x weekly - 3 sets - 10 reps - 3 hold  ASSESSMENT:  CLINICAL IMPRESSION: Today's session began with finishing up testing to include 2 MWT and 5 times sit to stand; demonstrates ability to stand on each leg x 60 although needs increased effort on the left.  Added in some stretching for left calf and hamstring today and updated HEP. Grade 2 mobs to lumbar spine   Eval:Patient is a 33 y.o. male who was seen today for physical therapy evaluation and treatment for back pain.   Patient demonstrates increased lumbar spine stiffness/pain following lumbar discectomy earlier this year. Pt demonstrates signs of decreased mobility and muscle guarding of surgical region. Pt demonstrates decreased LE/core strength, abnormal pain with AROM of lumbar spine, and impaired functional mobility. Patient also demonstrates positive result on RLE during straight leg test, increased back pain noted. Patient also demonstrates decreased speed with functional transfers and bed mobility during evaluation with cautious movement noted. Patient requires education on role of PT, HEP, HEP compliance and POC. Patient would benefit from skilled physical therapy for decreased low back pain, increased endurance with mobility, increased LE/core  strength, and balance for improved gait quality, return to higher level of function with ADLs, and progress towards therapy goals.   OBJECTIVE IMPAIRMENTS: decreased activity tolerance, decreased endurance, decreased mobility, difficulty walking, decreased ROM, decreased strength, hypomobility, and pain.   ACTIVITY LIMITATIONS: carrying, lifting, bending, sitting, standing, squatting, transfers, and bed mobility  PARTICIPATION LIMITATIONS: meal prep, cleaning, laundry, driving, shopping, community activity, occupation, and yard work  PERSONAL FACTORS: Age, Past/current experiences, and Time since onset of injury/illness/exacerbation are also affecting patient's functional outcome.   REHAB POTENTIAL: Good  CLINICAL DECISION MAKING: Stable/uncomplicated  EVALUATION COMPLEXITY: Low   GOALS: Goals reviewed with patient? No  SHORT TERM GOALS: Target date: 04/02/24  Pt will be independent with HEP in order to demonstrate participation in Physical Therapy POC.  Baseline: Goal status: INITIAL  2.  Pt will report 4/10 pain/stiffness with mobility in order to demonstrate improved pain with ADLs.  Baseline:  Goal status: INITIAL  LONG TERM GOALS: Target date: 04/23/24  Pt will increased lumbar spine ROM by a combined 20 degrees in order to demonstrate improved functional mobility to return to desired activities.  Baseline: see objective.  Goal status: INITIAL  2.  Pt will improve 2 MWT by at least 40 feet in order to demonstrate improved functional ambulatory capacity in community setting.  Baseline: see objective.  Goal status: INITIAL  3.  Pt will improve Modified Oswestry score by at least 6 points in order to demonstrate improved pain with functional goals and outcomes. Baseline: see objective.  Goal status: INITIAL  4.  Pt will report 2/10 pain with mobility in order to demonstrate reduced pain with ADLs lasting greater than 30 minutes.  Baseline: see objective.  Goal status:  INITIAL   PLAN:  PT FREQUENCY: 1-2x/week  PT DURATION: 6 weeks  PLANNED INTERVENTIONS: 97110-Therapeutic exercises, 97530- Therapeutic activity, [id number]- Neuromuscular re-education, 97535- Self Care, [id number]- Manual therapy, [phone]- Gait training, [phone]- Electrical stimulation (unattended), [phone]- Electrical stimulation (manual), Patient/Family education, Balance training, Stair training, Spinal mobilization, Cryotherapy, and Moist heat.  PLAN FOR NEXT SESSION:  progress core and proximal LE strengthening, consider manual therapy for lumbar joint tightness and STM for paraspinals   7:37 AM, 03/23/24 [address] Kjos Small Jairon Ripberger MPT [facility] [place] [phone] Ph:[phone]

## 2024-03-25 ENCOUNTER — Encounter (HOSPITAL_COMMUNITY): Payer: Self-pay

## 2024-03-25 ENCOUNTER — Ambulatory Visit (HOSPITAL_COMMUNITY)

## 2024-03-25 DIAGNOSIS — Z7409 Other reduced mobility: Secondary | ICD-10-CM

## 2024-03-25 DIAGNOSIS — M5459 Other low back pain: Secondary | ICD-10-CM | POA: Diagnosis not present

## 2024-03-25 DIAGNOSIS — M5386 Other specified dorsopathies, lumbar region: Secondary | ICD-10-CM

## 2024-03-25 NOTE — Therapy (Signed)
 OUTPATIENT PHYSICAL THERAPY THORACOLUMBAR TREATMENT   Patient Name: Christopher Frey MRN: [id number] DOB:15-Apr-1991, 33 y.o., male Today's Date: 03/25/2024  END OF SESSION:  PT End of Session - 03/25/24 0734     Visit Number 3    Number of Visits 12    Date for Recertification  04/23/24    Authorization Type [employer] OTHER    Authorization Time Period seeking auth    Authorization - Visit Number 2    Progress Note Due on Visit 10    PT Start Time 0730    PT Stop Time 0810    PT Time Calculation (min) 40 min    Activity Tolerance Patient tolerated treatment well;Patient limited by pain    Behavior During Therapy [facility] for tasks assessed/performed           Past Medical History:  Diagnosis Date   Acid reflux    Past Surgical History:  Procedure Laterality Date   BIOPSY  05/08/2021   Procedure: BIOPSY;  Surgeon: Cindie Carlin POUR, DO;  Location: [facility];  Service: Endoscopy;;   ESOPHAGOGASTRODUODENOSCOPY (EGD) WITH PROPOFOL  N/A 05/08/2021   Procedure: ESOPHAGOGASTRODUODENOSCOPY (EGD) WITH PROPOFOL ;  Surgeon: Cindie Carlin POUR, DO;  Location: [facility];  Service: Endoscopy;  Laterality: N/A;  3:00pm   SHOULDER SURGERY     Patient Active Problem List   Diagnosis Date Noted   Mild intermittent asthma without complication 01/16/2024   Seasonal and perennial allergic rhinitis 01/16/2024   Gastroesophageal reflux disease 01/16/2024   Difficulty walking 06/05/2012   Stiffness of joints, not elsewhere classified, multiple sites 06/05/2012    PCP: Shona Norleen PEDLAR, MD   REFERRING PROVIDER: Louis Shove, MD  REFERRING DIAG: back pain  Rationale for Evaluation and Treatment: Rehabilitation  THERAPY DIAG:  Other low back pain  Impaired functional mobility and activity tolerance  Decreased range of motion of lumbar spine  ONSET DATE: Been going on for years now.   SUBJECTIVE:                                                                                                                                                                                            SUBJECTIVE STATEMENT: Tightness in low back this morning; has returned to work.  Reports some cramping in left calf. Reports he is compliant with HEP.   EVAL: Pt states his back pain has been going on for years no MOI recalled. Pt states his back has been feeling really tight and stiff following surgery. Pt states he is going back to work Monday, has been out of work since June the 16th but has been  doing some light electrical work. Works with [employer] in [employer]. Pt states he went to chiropractor before surgery but has not been back. Pt states latest MRI revealed scar tissue around surgical area and some disc protrusion at L3-L4 but doc said he is not too concern about it and cleared him for work. Pt states he did not have any peripheral symptoms in LLE until a month after surgery, LLE reported as weaker and tingling feeling 2-3 times per day. Pt is very eager to return to full mobility of low back for work and to return to golf etc.   PERTINENT HISTORY:  L5-S1, surgery June 16th, had steroid shot few days ago (Wednesday 03/10/24)  PAIN:  Are you having pain? Yes: NPRS scale: 6/10 Pain location: mid low back Pain description: stiffness Aggravating factors: stooping, bending, sitting and standing Relieving factors: walking, ice, heat, pain meds  PRECAUTIONS: None  RED FLAGS: None   WEIGHT BEARING RESTRICTIONS: No  FALLS:  Has patient fallen in last 6 months? No   OCCUPATION: Maintenance for [employer]  PLOF: Independent and Independent with basic ADLs  PATIENT GOALS: decreased the low back stiffness, be able to stand and sit longer, see progression  NEXT MD VISIT: December  OBJECTIVE:  Note: Objective measures were completed at Evaluation unless otherwise noted.  DIAGNOSTIC FINDINGS:  CLINICAL DATA:  Low back pain   EXAM: MRI LUMBAR SPINE WITHOUT  CONTRAST   TECHNIQUE: Multiplanar, multisequence MR imaging of the lumbar spine was performed. No intravenous contrast was administered.   COMPARISON:  08/01/2012 MRI report, 02/07/2023 CT scan with images   FINDINGS: Segmentation:  Standard.   Alignment:  Physiologic.   Vertebrae: No fracture, evidence of discitis, or bone lesion. 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   Conus medullaris and cauda equina: Conus extends to the L1 level. Conus and cauda equina appear normal.   Paraspinal and other soft tissues: Negative.   Disc levels:   T12-L1: Unremarkable.   L1-L2: Shallow left paracentral disc protrusion without foraminal or canal stenosis.   L2-L3: Shallow disc protrusion in the left subarticular zone. No foraminal or canal stenosis.   L3-L4: Unremarkable.   L4-L5: Shallow central disc protrusion. No foraminal or canal stenosis.   L5-S1: Disc protrusion in the left subarticular zone results in left subarticular recess narrowing with impingement of the descending left S1 nerve root. No canal or foraminal stenosis.   IMPRESSION: 1. Disc protrusion in the left subarticular zone at L5-S1 results in left subarticular recess narrowing with impingement of the descending left S1 nerve root. 2. No canal or foraminal stenosis at any level.  PATIENT SURVEYS:  Modified Oswestry: 21 / 50 = 42.0 %  COGNITION: Overall cognitive status: Within functional limits for tasks assessed     SENSATION: Light touch: Impaired , LLE from butt cheek to feet, couple times per day  PALPATION: Pt demonstrates increased tenderness and tightness of bilateral lumbar paraspinals and CPA and UPA mobilizations of lumbar spine, L1-L5  LUMBAR ROM:   AROM eval  Flexion 25, pain  Extension 5, worse pain  Right lateral flexion 15, tightness  Left lateral flexion 10, nothing  Right rotation 75% available, tightness  Left rotation WFL   (Blank rows = not  tested)  LOWER EXTREMITY ROM:     Active  Right eval Left eval  Hip flexion    Hip extension    Hip abduction    Hip adduction    Hip internal  rotation    Hip external rotation    Knee flexion    Knee extension    Ankle dorsiflexion    Ankle plantarflexion    Ankle inversion    Ankle eversion     (Blank rows = not tested)  LOWER EXTREMITY MMT:    MMT Right eval Left eval  Hip flexion 4- 3+  Hip extension 4 4  Hip abduction 4 4-  Hip adduction    Hip internal rotation    Hip external rotation    Knee flexion    Knee extension    Ankle dorsiflexion    Ankle plantarflexion    Ankle inversion    Ankle eversion     (Blank rows = not tested)  LUMBAR SPECIAL TESTS:  Straight leg raise test: Positive, RLE positive for back pain  FUNCTIONAL TESTS:  5 times sit to stand: TBA14.37 sec 2 minute walk test: TBA 468 ft   SLS: TBA 60 each but needs more effort left side GAIT: Distance walked: 80 feet to and from treatment area Assistive device utilized: None Level of assistance: Complete Independence Comments: pt demonstrates close to normal gait pattern, initiation of gait is slow and cautious  TREATMENT DATE:  03/25/2024  Manual Therapy: -CPA/UPAs of Lumbar Spinal segments L3-L5, grade II-III mobilizations -STM of Lumbar Paraspinal musculature bilaterally Therapeutic Exercise: -Treadmill, 5 minutes, grade 2.0 incline, speed 1.8>2.8 -Supine bridges with GTB at knees, 2 sets of 10 reps, 3 second holds, pt cued for max hip extension  -LTR on green theraball, 2 set of 10 reps bilaterally, pt cued to remain in pain free ROM -DKTC, 2 set of 10 reps on green theraball, pt cued for pain free ROM Neuromuscular Re-education: -Pallof press, 2 sets of 10 reps, GTB at chest level, pt cued for core contraction throughout -Shoulder Extensions, 2 sets of 10 reps, GTB at chest level, pt cued for core contraction throughout -Bird dogs, 2 set of 8 reps, 4lb ankle weight added on  RLE, pt cued for neutral spine -Side plank, 3 reps of 10 second holds bilaterally, better performance on left side (Hx of right shoulder surgery) -Cat/Cow stretch, 1 set of 15 reps, pt cued for pain free ROM   03/23/24 5 times sit to stand 14.37 sec no UE assist 2 MWT 468 ft no AD SLS 60 each side; more difficulty left side Standing calf stretch  5 x 20 Soleus stretch 5 x 20 Supine: LTR 10 x 10 Active hamstring stretch 5 x 20 McGill 3 Abdominal curl 3 hold x 10 each Side plank 10 x 5 Bird dog x 10 each side Grade 2 mobs lumbar spine x 10 Instruction in use of tennis ball for soft tissue compression; release muscle knots Seated hamstring stretch 5 x 20    03/11/2024   Evaluation: -ROM measured, Strength assessed, HEP prescribed, pt educated on prognosis, findings, and importance of HEP compliance if given.  PATIENT EDUCATION:  Education details: Pt was educated on findings of PT evaluation, prognosis, frequency of therapy visits and rationale, attendance policy, and HEP if given.   Person educated: Patient Education method: Explanation, Verbal cues, and Handouts Education comprehension: verbalized understanding, verbal cues required, and needs further education  HOME EXERCISE PROGRAM: Access Code: [email or web address] URL: [email or web address] Date: 03/12/2024 Prepared by: Lang Ada  Exercises - Supine Lower Trunk Rotation  - 1 x daily - 7 x weekly - 3 sets - 10 reps - Bird Dog  - 1 x daily - 7 x weekly - 3 sets - 10 reps - Side Plank on Elbow  - 1 x daily - 7 x weekly - 3 sets - 10 reps - Neutral Curl Up with Straight Leg  - 1 x daily - 7 x weekly - 3 sets - 10 reps - 3 hold  ASSESSMENT:  CLINICAL IMPRESSION: Patient continues to demonstrate increased low back pain with extension of low back, decreased LLE/core strength, and  decreased activity tolerance. Patient also demonstrates good effort with all exercise during today's session. Patient able to  progress dynamic balance and core activation exercises today with Paloff press and bridge variations, good performance with verbal cueing. Trial of manual therapy for pain management and reported stiffness, plan to track next session. Patient would continue to benefit from skilled physical therapy for decreased low back pain, increased endurance with lumbar postural musculature, increased LE/core strength, and improved functional mobility for improved quality of life, improved independence with management of low back pain and continued progress towards therapy goals.   Eval:Patient is a 33 y.o. male who was seen today for physical therapy evaluation and treatment for back pain. Patient demonstrates increased lumbar spine stiffness/pain following lumbar discectomy earlier this year. Pt demonstrates signs of decreased mobility and muscle guarding of surgical region. Pt demonstrates decreased LE/core strength, abnormal pain with AROM of lumbar spine, and impaired functional mobility. Patient also demonstrates positive result on RLE during straight leg test, increased back pain noted. Patient also demonstrates decreased speed with functional transfers and bed mobility during evaluation with cautious movement noted. Patient requires education on role of PT, HEP, HEP compliance and POC. Patient would benefit from skilled physical therapy for decreased low back pain, increased endurance with mobility, increased LE/core strength, and balance for improved gait quality, return to higher level of function with ADLs, and progress towards therapy goals.   OBJECTIVE IMPAIRMENTS: decreased activity tolerance, decreased endurance, decreased mobility, difficulty walking, decreased ROM, decreased strength, hypomobility, and pain.   ACTIVITY LIMITATIONS: carrying, lifting, bending, sitting, standing,  squatting, transfers, and bed mobility  PARTICIPATION LIMITATIONS: meal prep, cleaning, laundry, driving, shopping, community activity, occupation, and yard work  PERSONAL FACTORS: Age, Past/current experiences, and Time since onset of injury/illness/exacerbation are also affecting patient's functional outcome.   REHAB POTENTIAL: Good  CLINICAL DECISION MAKING: Stable/uncomplicated  EVALUATION COMPLEXITY: Low   GOALS: Goals reviewed with patient? No  SHORT TERM GOALS: Target date: 04/02/24  Pt will be independent with HEP in order to demonstrate participation in Physical Therapy POC.  Baseline: Goal status: INITIAL  2.  Pt will report 4/10 pain/stiffness with mobility in order to demonstrate improved pain with ADLs.  Baseline:  Goal status: INITIAL  LONG TERM GOALS: Target date: 04/23/24  Pt will increased lumbar spine ROM by a combined 20 degrees in order to demonstrate improved functional mobility to return to desired activities.  Baseline: see objective.  Goal status: INITIAL  2.  Pt will improve  2 MWT by at least 40 feet in order to demonstrate improved functional ambulatory capacity in community setting.  Baseline: see objective.  Goal status: INITIAL  3.  Pt will improve Modified Oswestry score by at least 6 points in order to demonstrate improved pain with functional goals and outcomes. Baseline: see objective.  Goal status: INITIAL  4.  Pt will report 2/10 pain with mobility in order to demonstrate reduced pain with ADLs lasting greater than 30 minutes.  Baseline: see objective.  Goal status: INITIAL   PLAN:  PT FREQUENCY: 1-2x/week  PT DURATION: 6 weeks  PLANNED INTERVENTIONS: 97110-Therapeutic exercises, 97530- Therapeutic activity, [id number]- Neuromuscular re-education, 97535- Self Care, [id number]- Manual therapy, [phone]- Gait training, [phone]- Electrical stimulation (unattended), [phone]- Electrical stimulation (manual), Patient/Family education, Balance training,  Stair training, Spinal mobilization, Cryotherapy, and Moist heat.  PLAN FOR NEXT SESSION:  progress core and proximal LE strengthening, consider manual therapy for lumbar joint tightness and STM for paraspinals   Lang Ada, PT, DPT [facility] Office: [phone] 8:20 AM, 03/25/24

## 2024-03-30 ENCOUNTER — Ambulatory Visit (HOSPITAL_COMMUNITY): Admitting: Physical Therapy

## 2024-03-30 DIAGNOSIS — Z7409 Other reduced mobility: Secondary | ICD-10-CM

## 2024-03-30 DIAGNOSIS — M5459 Other low back pain: Secondary | ICD-10-CM

## 2024-03-30 DIAGNOSIS — M5386 Other specified dorsopathies, lumbar region: Secondary | ICD-10-CM

## 2024-03-30 NOTE — Therapy (Signed)
 OUTPATIENT PHYSICAL THERAPY THORACOLUMBAR TREATMENT   Patient Name: Christopher Frey MRN: [id number] DOB:May 12, 1991, 33 y.o., male Today's Date: 03/30/2024  END OF SESSION:  PT End of Session - 03/30/24 0730     Visit Number 4    Number of Visits 12    Date for Recertification  04/23/24    Authorization Type [employer] OTHER    Authorization Time Period seeking auth    Progress Note Due on Visit 10    PT Start Time 0730    PT Stop Time 0810    PT Time Calculation (min) 40 min    Activity Tolerance Patient tolerated treatment well;Patient limited by pain    Behavior During Therapy [facility] for tasks assessed/performed           Past Medical History:  Diagnosis Date   Acid reflux    Past Surgical History:  Procedure Laterality Date   BIOPSY  05/08/2021   Procedure: BIOPSY;  Surgeon: Cindie Carlin POUR, DO;  [facility]: [facility];  Service: Endoscopy;;   ESOPHAGOGASTRODUODENOSCOPY (EGD) WITH PROPOFOL  N/A 05/08/2021   Procedure: ESOPHAGOGASTRODUODENOSCOPY (EGD) WITH PROPOFOL ;  Surgeon: Cindie Carlin POUR, DO;  Location: [facility];  Service: Endoscopy;  Laterality: N/A;  3:00pm   SHOULDER SURGERY     Patient Active Problem List   Diagnosis Date Noted   Mild intermittent asthma without complication 01/16/2024   Seasonal and perennial allergic rhinitis 01/16/2024   Gastroesophageal reflux disease 01/16/2024   Difficulty walking 06/05/2012   Stiffness of joints, not elsewhere classified, multiple sites 06/05/2012    PCP: Shona Norleen PEDLAR, MD   REFERRING PROVIDER: Louis Shove, MD  REFERRING DIAG: back pain  Rationale for Evaluation and Treatment: Rehabilitation  THERAPY DIAG:  Other low back pain  Impaired functional mobility and activity tolerance  Decreased range of motion of lumbar spine  ONSET DATE: Been going on for years now.   SUBJECTIVE:                                                                                                                                                                                            SUBJECTIVE STATEMENT: Tightness continues in LB first thing in morning and again later in evening. States he had to lift something with some weight at work but used his legs. Reports he is compliant with HEP.   EVAL: Pt states his back pain has been going on for years no MOI recalled. Pt states his back has been feeling really tight and stiff following surgery. Pt states he is going back to work Monday, has been out of work since June the 16th  but has been doing some light electrical work. Works with freight forwarder in [employer]. Pt states he went to chiropractor before surgery but has not been back. Pt states latest MRI revealed scar tissue around surgical area and some disc protrusion at L3-L4 but doc said he is not too concern about it and cleared him for work. Pt states he did not have any peripheral symptoms in LLE until a month after surgery, LLE reported as weaker and tingling feeling 2-3 times per day. Pt is very eager to return to full mobility of low back for work and to return to golf etc.   PERTINENT HISTORY:  L5-S1, surgery June 16th, had steroid shot few days ago (Wednesday 03/10/24)  PAIN:  Are you having pain? Yes: NPRS scale: 6/10 Pain location: mid low back Pain description: stiffness Aggravating factors: stooping, bending, sitting and standing Relieving factors: walking, ice, heat, pain meds  PRECAUTIONS: None  RED FLAGS: None   WEIGHT BEARING RESTRICTIONS: No  FALLS:  Has patient fallen in last 6 months? No   OCCUPATION: Maintenance for [employer]  PLOF: Independent and Independent with basic ADLs  PATIENT GOALS: decreased the low back stiffness, be able to stand and sit longer, see progression  NEXT MD VISIT: December  OBJECTIVE:  Note: Objective measures were completed at Evaluation unless otherwise noted.  DIAGNOSTIC FINDINGS:  CLINICAL DATA:  Low back pain   EXAM: MRI  LUMBAR SPINE WITHOUT CONTRAST   TECHNIQUE: Multiplanar, multisequence MR imaging of the lumbar spine was performed. No intravenous contrast was administered.   COMPARISON:  08/01/2012 MRI report, 02/07/2023 CT scan with images   FINDINGS: Segmentation:  Standard.   Alignment:  Physiologic.   Vertebrae: No fracture, evidence of discitis, or bone lesion. 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   Conus medullaris and cauda equina: Conus extends to the L1 level. Conus and cauda equina appear normal.   Paraspinal and other soft tissues: Negative.   Disc levels:   T12-L1: Unremarkable.   L1-L2: Shallow left paracentral disc protrusion without foraminal or canal stenosis.   L2-L3: Shallow disc protrusion in the left subarticular zone. No foraminal or canal stenosis.   L3-L4: Unremarkable.   L4-L5: Shallow central disc protrusion. No foraminal or canal stenosis.   L5-S1: Disc protrusion in the left subarticular zone results in left subarticular recess narrowing with impingement of the descending left S1 nerve root. No canal or foraminal stenosis.   IMPRESSION: 1. Disc protrusion in the left subarticular zone at L5-S1 results in left subarticular recess narrowing with impingement of the descending left S1 nerve root. 2. No canal or foraminal stenosis at any level.  PATIENT SURVEYS:  Modified Oswestry: 21 / 50 = 42.0 %  COGNITION: Overall cognitive status: Within functional limits for tasks assessed     SENSATION: Light touch: Impaired , LLE from butt cheek to feet, couple times per day  PALPATION: Pt demonstrates increased tenderness and tightness of bilateral lumbar paraspinals and CPA and UPA mobilizations of lumbar spine, L1-L5  LUMBAR ROM:   AROM eval  Flexion 25, pain  Extension 5, worse pain  Right lateral flexion 15, tightness  Left lateral flexion 10, nothing  Right rotation 75% available, tightness  Left rotation WFL   (Blank rows  = not tested)   LOWER EXTREMITY MMT:    MMT Right eval Left eval  Hip flexion 4- 3+  Hip extension 4 4  Hip abduction 4 4-  Hip adduction  Hip internal rotation    Hip external rotation    Knee flexion    Knee extension    Ankle dorsiflexion    Ankle plantarflexion    Ankle inversion    Ankle eversion     (Blank rows = not tested)  LUMBAR SPECIAL TESTS:  Straight leg raise test: Positive, RLE positive for back pain  FUNCTIONAL TESTS:  5 times sit to stand: TBA14.37 sec 2 minute walk test: TBA 468 ft   SLS: TBA 60 each but needs more effort left side GAIT: Distance walked: 80 feet to and from treatment area Assistive device utilized: None Level of assistance: Complete Independence Comments: pt demonstrates close to normal gait pattern, initiation of gait is slow and cautious  TREATMENT DATE:  03/30/24 Standing:  pallof with NBOS GTB 2X10 each side  Rows GTB 2X10  Shoulder extensions GTB 2X10 Doorway stretch 3X30 Treadmill 5 minutes 2% speed 2.4-2.8 mph Quadruped bird dog 2X10  Cat/cow 2X10 Prone on elbows 1 minute  Press ups 5X Manual, STM/trigger point in prone to LT lumbar paraspinals Education of body mechanics  03/25/2024  Manual Therapy: -CPA/UPAs of Lumbar Spinal segments L3-L5, grade II-III mobilizations -STM of Lumbar Paraspinal musculature bilaterally Therapeutic Exercise: -Treadmill, 5 minutes, grade 2.0 incline, speed 1.8>2.8 -Supine bridges with GTB at knees, 2 sets of 10 reps, 3 second holds, pt cued for max hip extension  -LTR on green theraball, 2 set of 10 reps bilaterally, pt cued to remain in pain free ROM -DKTC, 2 set of 10 reps on green theraball, pt cued for pain free ROM Neuromuscular Re-education: -Pallof press, 2 sets of 10 reps, GTB at chest level, pt cued for core contraction throughout -Shoulder Extensions, 2 sets of 10 reps, GTB at chest level, pt cued for core contraction throughout -Bird dogs, 2 set of 8 reps, 4lb ankle  weight added on RLE, pt cued for neutral spine -Side plank, 3 reps of 10 second holds bilaterally, better performance on left side (Hx of right shoulder surgery) -Cat/Cow stretch, 1 set of 15 reps, pt cued for pain free ROM   03/23/24 5 times sit to stand 14.37 sec no UE assist 2 MWT 468 ft no AD SLS 60 each side; more difficulty left side Standing calf stretch  5 x 20 Soleus stretch 5 x 20 Supine: LTR 10 x 10 Active hamstring stretch 5 x 20 McGill 3 Abdominal curl 3 hold x 10 each Side plank 10 x 5 Bird dog x 10 each side Grade 2 mobs lumbar spine x 10 Instruction in use of tennis ball for soft tissue compression; release muscle knots Seated hamstring stretch 5 x 20    03/11/2024   Evaluation: -ROM measured, Strength assessed, HEP prescribed, pt educated on prognosis, findings, and importance of HEP compliance if given.  PATIENT EDUCATION:  Education details: Pt was educated on findings of PT evaluation, prognosis, frequency of therapy visits and rationale, attendance policy, and HEP if given.   Person educated: Patient Education method: Explanation, Verbal cues, and Handouts Education comprehension: verbalized understanding, verbal cues required, and needs further education  HOME EXERCISE PROGRAM: Access Code: [email or web address] URL: [email or web address] Date: 03/12/2024 Prepared by: Lang Ada  Exercises - Supine Lower Trunk Rotation  - 1 x daily - 7 x weekly - 3 sets - 10 reps - Bird Dog  - 1 x daily - 7 x weekly - 3 sets - 10 reps - Side Plank on Elbow  - 1 x daily - 7 x weekly - 3 sets - 10 reps - Neutral Curl Up with Straight Leg  - 1 x daily - 7 x weekly - 3 sets - 10 reps - 3 hold  ASSESSMENT:  CLINICAL IMPRESSION: Continued focus on improving lumbar mobility, core stabilization and decreasing LBP.  Began rows using  tband resistance with cues to keep head in alignment with activity.  Chest stretch was added in doorway with tightness reported and relief following.  Introduced to lumbar extension in prone which also produced relief of symptoms.  Press ups completed in half ROM.  Educated on importance of maintaining lumbar curve when lifting or completing other activities.  Discussed his work duties which does require lifting and will need further education regarding this.  Manual completed to left lumbar paraspinal which was restricted compared to opposing side.  Able to reduce spasm approx 50% with manual and pt reported improvement following.  No pain reported during session or at completion today.  Pt reported no significant change in stiffness at end of day as of yet, however informed it takes 4-6 weeks to see change. Pt verbalized understanding.  Pt will continue to benefit from skilled physical therapy for decreased low back pain, increased endurance with lumbar postural musculature, increased LE/core strength, and improved functional mobility for improved quality of life, improved independence with management of low back pain and continued progress towards therapy goals.   Eval:Patient is a 33 y.o. male who was seen today for physical therapy evaluation and treatment for back pain. Patient demonstrates increased lumbar spine stiffness/pain following lumbar discectomy earlier this year. Pt demonstrates signs of decreased mobility and muscle guarding of surgical region. Pt demonstrates decreased LE/core strength, abnormal pain with AROM of lumbar spine, and impaired functional mobility. Patient also demonstrates positive result on RLE during straight leg test, increased back pain noted. Patient also demonstrates decreased speed with functional transfers and bed mobility during evaluation with cautious movement noted. Patient requires education on role of PT, HEP, HEP compliance and POC. Patient would benefit from skilled  physical therapy for decreased low back pain, increased endurance with mobility, increased LE/core strength, and balance for improved gait quality, return to higher level of function with ADLs, and progress towards therapy goals.   OBJECTIVE IMPAIRMENTS: decreased activity tolerance, decreased endurance, decreased mobility, difficulty walking, decreased ROM, decreased strength, hypomobility, and pain.   ACTIVITY LIMITATIONS: carrying, lifting, bending, sitting, standing, squatting, transfers, and bed mobility  PARTICIPATION LIMITATIONS: meal prep, cleaning, laundry, driving, shopping, community activity, occupation, and yard work  PERSONAL FACTORS: Age, Past/current experiences, and Time since onset of injury/illness/exacerbation are also affecting patient's functional outcome.   REHAB POTENTIAL: Good  CLINICAL DECISION MAKING: Stable/uncomplicated  EVALUATION COMPLEXITY: Low   GOALS: Goals reviewed with patient? No  SHORT TERM GOALS: Target date: 04/02/24  Pt will be  independent with HEP in order to demonstrate participation in Physical Therapy POC.  Baseline: Goal status: INITIAL  2.  Pt will report 4/10 pain/stiffness with mobility in order to demonstrate improved pain with ADLs.  Baseline:  Goal status: INITIAL  LONG TERM GOALS: Target date: 04/23/24  Pt will increased lumbar spine ROM by a combined 20 degrees in order to demonstrate improved functional mobility to return to desired activities.  Baseline: see objective.  Goal status: INITIAL  2.  Pt will improve 2 MWT by at least 40 feet in order to demonstrate improved functional ambulatory capacity in community setting.  Baseline: see objective.  Goal status: INITIAL  3.  Pt will improve Modified Oswestry score by at least 6 points in order to demonstrate improved pain with functional goals and outcomes. Baseline: see objective.  Goal status: INITIAL  4.  Pt will report 2/10 pain with mobility in order to  demonstrate reduced pain with ADLs lasting greater than 30 minutes.  Baseline: see objective.  Goal status: INITIAL   PLAN:  PT FREQUENCY: 1-2x/week  PT DURATION: 6 weeks  PLANNED INTERVENTIONS: 97110-Therapeutic exercises, 97530- Therapeutic activity, [id number]- Neuromuscular re-education, 97535- Self Care, [id number]- Manual therapy, [phone]- Gait training, [phone]- Electrical stimulation (unattended), [phone]- Electrical stimulation (manual), Patient/Family education, Balance training, Stair training, Spinal mobilization, Cryotherapy, and Moist heat.  PLAN FOR NEXT SESSION:  progress core and proximal LE strengthening, manual as needed. Work and educate on correct lifting to ensure correct form, body mechanics as has a physical job   Greig KATHEE Fuse, PTA/CLT [facility] Ph: [phone]  9:26 AM, 03/30/24

## 2024-04-02 ENCOUNTER — Encounter (HOSPITAL_COMMUNITY): Payer: Self-pay

## 2024-04-02 ENCOUNTER — Ambulatory Visit (HOSPITAL_COMMUNITY)

## 2024-04-02 DIAGNOSIS — M5459 Other low back pain: Secondary | ICD-10-CM | POA: Diagnosis not present

## 2024-04-02 DIAGNOSIS — M5386 Other specified dorsopathies, lumbar region: Secondary | ICD-10-CM

## 2024-04-02 DIAGNOSIS — Z7409 Other reduced mobility: Secondary | ICD-10-CM

## 2024-04-02 NOTE — Therapy (Signed)
 OUTPATIENT PHYSICAL THERAPY THORACOLUMBAR TREATMENT   Patient Name: Christopher Frey MRN: [id number] DOB:10-Jan-1991, 33 y.o., male Today's Date: 04/02/2024  END OF SESSION:  PT End of Session - 04/02/24 0734     Visit Number 5    Number of Visits 12    Date for Recertification  04/23/24    Authorization Type [employer] OTHER    Progress Note Due on Visit 10    PT Start Time 0734    PT Stop Time 0813    PT Time Calculation (min) 39 min    Activity Tolerance Patient tolerated treatment well;Patient limited by pain    Behavior During Therapy [facility] for tasks assessed/performed           Past Medical History:  Diagnosis Date   Acid reflux    Past Surgical History:  Procedure Laterality Date   BIOPSY  05/08/2021   Procedure: BIOPSY;  Surgeon: Cindie Carlin POUR, DO;  Location: [facility];  Service: Endoscopy;;   ESOPHAGOGASTRODUODENOSCOPY (EGD) WITH PROPOFOL  N/A 05/08/2021   Procedure: ESOPHAGOGASTRODUODENOSCOPY (EGD) WITH PROPOFOL ;  Surgeon: Cindie Carlin POUR, DO;  Location: [facility];  Service: Endoscopy;  Laterality: N/A;  3:00pm   SHOULDER SURGERY     Patient Active Problem List   Diagnosis Date Noted   Mild intermittent asthma without complication 01/16/2024   Seasonal and perennial allergic rhinitis 01/16/2024   Gastroesophageal reflux disease 01/16/2024   Difficulty walking 06/05/2012   Stiffness of joints, not elsewhere classified, multiple sites 06/05/2012    PCP: Shona Norleen PEDLAR, MD   REFERRING PROVIDER: Louis Shove, MD  REFERRING DIAG: back pain  Rationale for Evaluation and Treatment: Rehabilitation  THERAPY DIAG:  Other low back pain  Impaired functional mobility and activity tolerance  Decreased range of motion of lumbar spine  ONSET DATE: Been going on for years now.   SUBJECTIVE:                                                                                                                                                                                            SUBJECTIVE STATEMENT: 04/02/24:  Reports soreness and some tightness in lower back, pain scale 4/10.  Does have some lifting with work but uses his legs.  Reports improvements following massage last session.    EVAL: Pt states his back pain has been going on for years no MOI recalled. Pt states his back has been feeling really tight and stiff following surgery. Pt states he is going back to work Monday, has been out of work since June the 16th but has been doing some chiropodist work. 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 Pt states he went to chiropractor before surgery but has not been back. Pt states latest MRI revealed scar tissue around surgical area and some disc protrusion at L3-L4 but doc said he is not too concern about it and cleared him for work. Pt states he did not have any peripheral symptoms in LLE until a month after surgery, LLE reported as weaker and tingling feeling 2-3 times per day. Pt is very eager to return to full mobility of low back for work and to return to golf etc.   PERTINENT HISTORY:  L5-S1, surgery June 16th, had steroid shot few days ago (Wednesday 03/10/24)  PAIN:  Are you having pain? Yes: NPRS scale: 6/10 Pain location: mid low back Pain description: stiffness Aggravating factors: stooping, bending, sitting and standing Relieving factors: walking, ice, heat, pain meds  PRECAUTIONS: None  RED FLAGS: None   WEIGHT BEARING RESTRICTIONS: No  FALLS:  Has patient fallen in last 6 months? No   OCCUPATION: Maintenance for [employer]  PLOF: Independent and Independent with basic ADLs  PATIENT GOALS: decreased the low back stiffness, be able to stand and sit longer, see progression  NEXT MD VISIT: December 12th, 2025  OBJECTIVE:  Note: Objective measures were completed at Evaluation unless otherwise noted.  DIAGNOSTIC FINDINGS:  CLINICAL DATA:  Low back pain   EXAM: MRI LUMBAR SPINE WITHOUT CONTRAST    TECHNIQUE: Multiplanar, multisequence MR imaging of the lumbar spine was performed. No intravenous contrast was administered.   COMPARISON:  08/01/2012 MRI report, 02/07/2023 CT scan with images   FINDINGS: Segmentation:  Standard.   Alignment:  Physiologic.   Vertebrae: No fracture, evidence of discitis, or bone lesion. 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   Conus medullaris and cauda equina: Conus extends to the L1 level. Conus and cauda equina appear normal.   Paraspinal and other soft tissues: Negative.   Disc levels:   T12-L1: Unremarkable.   L1-L2: Shallow left paracentral disc protrusion without foraminal or canal stenosis.   L2-L3: Shallow disc protrusion in the left subarticular zone. No foraminal or canal stenosis.   L3-L4: Unremarkable.   L4-L5: Shallow central disc protrusion. No foraminal or canal stenosis.   L5-S1: Disc protrusion in the left subarticular zone results in left subarticular recess narrowing with impingement of the descending left S1 nerve root. No canal or foraminal stenosis.   IMPRESSION: 1. Disc protrusion in the left subarticular zone at L5-S1 results in left subarticular recess narrowing with impingement of the descending left S1 nerve root. 2. No canal or foraminal stenosis at any level.  PATIENT SURVEYS:  Modified Oswestry: 21 / 50 = 42.0 %  COGNITION: Overall cognitive status: Within functional limits for tasks assessed     SENSATION: Light touch: Impaired , LLE from butt cheek to feet, couple times per day  PALPATION: Pt demonstrates increased tenderness and tightness of bilateral lumbar paraspinals and CPA and UPA mobilizations of lumbar spine, L1-L5  LUMBAR ROM:   AROM eval  Flexion 25, pain  Extension 5, worse pain  Right lateral flexion 15, tightness  Left lateral flexion 10, nothing  Right rotation 75% available, tightness  Left rotation WFL   (Blank rows = not tested)   LOWER  EXTREMITY MMT:    MMT Right eval Left eval  Hip flexion 4- 3+  Hip extension 4 4  Hip abduction 4 4-  Hip adduction    Hip internal rotation    Hip external  rotation    Knee flexion    Knee extension    Ankle dorsiflexion    Ankle plantarflexion    Ankle inversion    Ankle eversion     (Blank rows = not tested)  LUMBAR SPECIAL TESTS:  Straight leg raise test: Positive, RLE positive for back pain  FUNCTIONAL TESTS:  5 times sit to stand: TBA14.37 sec 2 minute walk test: TBA 468 ft   SLS: TBA 60 each but needs more effort left side GAIT: Distance walked: 80 feet to and from treatment area Assistive device utilized: None Level of assistance: Complete Independence Comments: pt demonstrates close to normal gait pattern, initiation of gait is slow and cautious  TREATMENT DATE:  04/02/24: Treadmill 5 minutes 2% speed 2.5-2.8 mph Quadruped bird dog 10x 5   Cat/cow 10x 10   Child's pose neutral, Rt and Lt 30 each Standing:  3D hip excursion (squat, extension, rotation, sidebend) Manual: prone STM to lumbar erector spinae, paraspinals, QL  03/30/24 Standing:  pallof with NBOS GTB 2X10 each side  Rows GTB 2X10  Shoulder extensions GTB 2X10 Doorway stretch 3X30 Treadmill 5 minutes 2% speed 2.4-2.8 mph Quadruped bird dog 2X10  Cat/cow 2X10 Prone on elbows 1 minute  Press ups 5X Manual, STM/trigger point in prone to LT lumbar paraspinals Education of body mechanics  03/25/2024  Manual Therapy: -CPA/UPAs of Lumbar Spinal segments L3-L5, grade II-III mobilizations -STM of Lumbar Paraspinal musculature bilaterally Therapeutic Exercise: -Treadmill, 5 minutes, grade 2.0 incline, speed 1.8>2.8 -Supine bridges with GTB at knees, 2 sets of 10 reps, 3 second holds, pt cued for max hip extension  -LTR on green theraball, 2 set of 10 reps bilaterally, pt cued to remain in pain free ROM -DKTC, 2 set of 10 reps on green theraball, pt cued for pain free ROM Neuromuscular  Re-education: -Pallof press, 2 sets of 10 reps, GTB at chest level, pt cued for core contraction throughout -Shoulder Extensions, 2 sets of 10 reps, GTB at chest level, pt cued for core contraction throughout -Bird dogs, 2 set of 8 reps, 4lb ankle weight added on RLE, pt cued for neutral spine -Side plank, 3 reps of 10 second holds bilaterally, better performance on left side (Hx of right shoulder surgery) -Cat/Cow stretch, 1 set of 15 reps, pt cued for pain free ROM   03/23/24 5 times sit to stand 14.37 sec no UE assist 2 MWT 468 ft no AD SLS 60 each side; more difficulty left side Standing calf stretch  5 x 20 Soleus stretch 5 x 20 Supine: LTR 10 x 10 Active hamstring stretch 5 x 20 McGill 3 Abdominal curl 3 hold x 10 each Side plank 10 x 5 Bird dog x 10 each side Grade 2 mobs lumbar spine x 10 Instruction in use of tennis ball for soft tissue compression; release muscle knots Seated hamstring stretch 5 x 20    03/11/2024   Evaluation: -ROM measured, Strength assessed, HEP prescribed, pt educated on prognosis, findings, and importance of HEP compliance if given.  PATIENT EDUCATION:  Education details: Pt was educated on findings of PT evaluation, prognosis, frequency of therapy visits and rationale, attendance policy, and HEP if given.   Person educated: Patient Education method: Explanation, Verbal cues, and Handouts Education comprehension: verbalized understanding, verbal cues required, and needs further education  HOME EXERCISE PROGRAM: Access Code: [email or web address] URL: [email or web address] Date: 03/12/2024 Prepared by: Lang Ada  Exercises - Supine Lower Trunk Rotation  - 1 x daily - 7 x weekly - 3 sets - 10 reps - Bird Dog  - 1 x daily - 7 x weekly - 3 sets - 10 reps - Side Plank on Elbow  - 1 x daily - 7 x weekly -  3 sets - 10 reps - Neutral Curl Up with Straight Leg  - 1 x daily - 7 x weekly - 3 sets - 10 reps - 3 hold  ASSESSMENT:  CLINICAL IMPRESSION: Added squats for functional strengthening with min cueing for mechanics and child's pose for mobility.  Pt tolerated well to all exercises with no reports of increased pain.  Manual complete at EOS to address paraspinal spasms, able to reduce though unable to fully resolved.  Discussed benefits of massage therapy to assist with tight musculature.  N\     Eval:Patient is a 33 y.o. male who was seen today for physical therapy evaluation and treatment for back pain. Patient demonstrates increased lumbar spine stiffness/pain following lumbar discectomy earlier this year. Pt demonstrates signs of decreased mobility and muscle guarding of surgical region. Pt demonstrates decreased LE/core strength, abnormal pain with AROM of lumbar spine, and impaired functional mobility. Patient also demonstrates positive result on RLE during straight leg test, increased back pain noted. Patient also demonstrates decreased speed with functional transfers and bed mobility during evaluation with cautious movement noted. Patient requires education on role of PT, HEP, HEP compliance and POC. Patient would benefit from skilled physical therapy for decreased low back pain, increased endurance with mobility, increased LE/core strength, and balance for improved gait quality, return to higher level of function with ADLs, and progress towards therapy goals.   OBJECTIVE IMPAIRMENTS: decreased activity tolerance, decreased endurance, decreased mobility, difficulty walking, decreased ROM, decreased strength, hypomobility, and pain.   ACTIVITY LIMITATIONS: carrying, lifting, bending, sitting, standing, squatting, transfers, and bed mobility  PARTICIPATION LIMITATIONS: meal prep, cleaning, laundry, driving, shopping, community activity, occupation, and yard work  PERSONAL FACTORS: Age,  Past/current experiences, and Time since onset of injury/illness/exacerbation are also affecting patient's functional outcome.   REHAB POTENTIAL: Good  CLINICAL DECISION MAKING: Stable/uncomplicated  EVALUATION COMPLEXITY: Low   GOALS: Goals reviewed with patient? No  SHORT TERM GOALS: Target date: 04/02/24  Pt will be independent with HEP in order to demonstrate participation in Physical Therapy POC.  Baseline: Goal status: INITIAL  2.  Pt will report 4/10 pain/stiffness with mobility in order to demonstrate improved pain with ADLs.  Baseline:  Goal status: INITIAL  LONG TERM GOALS: Target date: 04/23/24  Pt will increased lumbar spine ROM by a combined 20 degrees in order to demonstrate improved functional mobility to return to desired activities.  Baseline: see objective.  Goal status: INITIAL  2.  Pt will improve 2 MWT by at least 40 feet in order to demonstrate improved functional ambulatory capacity in community setting.  Baseline: see objective.  Goal status: INITIAL  3.  Pt will improve Modified Oswestry score by at least 6 points in order to demonstrate improved pain with functional goals and outcomes. Baseline: see objective.  Goal status: INITIAL  4.  Pt will report 2/10 pain with mobility in order to demonstrate reduced pain with ADLs lasting greater than 30 minutes.  Baseline: see objective.  Goal status: INITIAL   PLAN:  PT FREQUENCY: 1-2x/week  PT DURATION: 6 weeks  PLANNED INTERVENTIONS: 97110-Therapeutic exercises, 97530- Therapeutic activity, [id number]- Neuromuscular re-education, 97535- Self Care, [id number]- Manual therapy, [phone]- Gait training, [phone]- Electrical stimulation (unattended), [phone]- Electrical stimulation (manual), Patient/Family education, Balance training, Stair training, Spinal mobilization, Cryotherapy, and Moist heat.  PLAN FOR NEXT SESSION:  progress core and proximal LE strengthening, manual as needed. Work and educate on correct lifting  to ensure correct form, body mechanics as has a physical job.  Add lunges next session.   Augustin Mclean, LPTA/CLT; CBIS [phone]  11:49 AM, 04/02/24

## 2024-04-05 ENCOUNTER — Ambulatory Visit (HOSPITAL_COMMUNITY): Attending: Neurosurgery | Admitting: Physical Therapy

## 2024-04-05 DIAGNOSIS — M5459 Other low back pain: Secondary | ICD-10-CM | POA: Insufficient documentation

## 2024-04-05 DIAGNOSIS — M5386 Other specified dorsopathies, lumbar region: Secondary | ICD-10-CM | POA: Insufficient documentation

## 2024-04-05 DIAGNOSIS — Z7409 Other reduced mobility: Secondary | ICD-10-CM | POA: Diagnosis present

## 2024-04-05 NOTE — Therapy (Addendum)
 OUTPATIENT PHYSICAL THERAPY THORACOLUMBAR TREATMENT PHYSICAL THERAPY DISCHARGE SUMMARY  Visits from Start of Care: 5  Current functional level related to goals / functional outcomes: Please see last treatment note   Remaining deficits: Please see last treatment note   Education / Equipment: Please see last treatment note, pt has not returned.   Patient agrees to discharge. Patient goals were not met. Patient is being discharged due to not returning since the last visit.    Patient Name: Christopher Frey MRN: [id number] DOB:1990/12/02, 33 y.o., male Today's Date: 04/05/2024  END OF SESSION:  PT End of Session - 04/05/24 0741     Visit Number 6    Number of Visits 12    Date for Recertification  04/23/24    Authorization Type [employer] OTHER    Progress Note Due on Visit 10    PT Start Time 0735    PT Stop Time 0815    PT Time Calculation (min) 40 min    Activity Tolerance Patient tolerated treatment well;Patient limited by pain    Behavior During Therapy [facility] for tasks assessed/performed           Past Medical History:  Diagnosis Date   Acid reflux    Past Surgical History:  Procedure Laterality Date   BIOPSY  05/08/2021   Procedure: BIOPSY;  Surgeon: Cindie Carlin POUR, DO;  [facility]: [facility];  Service: Endoscopy;;   ESOPHAGOGASTRODUODENOSCOPY (EGD) WITH PROPOFOL  N/A 05/08/2021   Procedure: ESOPHAGOGASTRODUODENOSCOPY (EGD) WITH PROPOFOL ;  Surgeon: Cindie Carlin POUR, DO;  Location: [facility];  Service: Endoscopy;  Laterality: N/A;  3:00pm   SHOULDER SURGERY     Patient Active Problem List   Diagnosis Date Noted   Mild intermittent asthma without complication 01/16/2024   Seasonal and perennial allergic rhinitis 01/16/2024   Gastroesophageal reflux disease 01/16/2024   Difficulty walking 06/05/2012   Stiffness of joints, not elsewhere classified, multiple sites 06/05/2012    PCP: Shona Norleen PEDLAR, MD   REFERRING PROVIDER: Louis Shove,  MD  REFERRING DIAG: back pain  Rationale for Evaluation and Treatment: Rehabilitation  THERAPY DIAG:  Other low back pain  Impaired functional mobility and activity tolerance  Decreased range of motion of lumbar spine  ONSET DATE: Been going on for years now.   SUBJECTIVE:                                                                                                                                                                                           SUBJECTIVE STATEMENT: 04/02/24:  Reports soreness and some tightness in lower back especially with prolonged standing. Feels it's getting  some better.  Planning on making appt for massage therapist.     EVAL: Pt states his back pain has been going on for years no MOI recalled. Pt states his back has been feeling really tight and stiff following surgery. Pt states he is going back to work Monday, has been out of work since June the 16th but has been doing some chiropodist work. Works with freight forwarder in [employer]. Pt states he went to chiropractor before surgery but has not been back. Pt states latest MRI revealed scar tissue around surgical area and some disc protrusion at L3-L4 but doc said he is not too concern about it and cleared him for work. Pt states he did not have any peripheral symptoms in LLE until a month after surgery, LLE reported as weaker and tingling feeling 2-3 times per day. Pt is very eager to return to full mobility of low back for work and to return to golf etc.   PERTINENT HISTORY:  L5-S1, surgery June 16th, had steroid shot few days ago (Wednesday 03/10/24)  PAIN:  Are you having pain? Yes: NPRS scale: 6/10 Pain location: mid low back Pain description: stiffness Aggravating factors: stooping, bending, sitting and standing Relieving factors: walking, ice, heat, pain meds  PRECAUTIONS: None  RED FLAGS: None   WEIGHT BEARING RESTRICTIONS: No  FALLS:  Has patient fallen in last 6 months?  No   OCCUPATION: Maintenance for [employer]  PLOF: Independent and Independent with basic ADLs  PATIENT GOALS: decreased the low back stiffness, be able to stand and sit longer, see progression  NEXT MD VISIT: December 12th, 2025  OBJECTIVE:  Note: Objective measures were completed at Evaluation unless otherwise noted.  DIAGNOSTIC FINDINGS:  CLINICAL DATA:  Low back pain   EXAM: MRI LUMBAR SPINE WITHOUT CONTRAST   TECHNIQUE: Multiplanar, multisequence MR imaging of the lumbar spine was performed. No intravenous contrast was administered.   COMPARISON:  08/01/2012 MRI report, 02/07/2023 CT scan with images   FINDINGS: Segmentation:  Standard.   Alignment:  Physiologic.   Vertebrae: No fracture, evidence of discitis, or bone lesion. 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   Conus medullaris and cauda equina: Conus extends to the L1 level. Conus and cauda equina appear normal.   Paraspinal and other soft tissues: Negative.   Disc levels:   T12-L1: Unremarkable.   L1-L2: Shallow left paracentral disc protrusion without foraminal or canal stenosis.   L2-L3: Shallow disc protrusion in the left subarticular zone. No foraminal or canal stenosis.   L3-L4: Unremarkable.   L4-L5: Shallow central disc protrusion. No foraminal or canal stenosis.   L5-S1: Disc protrusion in the left subarticular zone results in left subarticular recess narrowing with impingement of the descending left S1 nerve root. No canal or foraminal stenosis.   IMPRESSION: 1. Disc protrusion in the left subarticular zone at L5-S1 results in left subarticular recess narrowing with impingement of the descending left S1 nerve root. 2. No canal or foraminal stenosis at any level.  PATIENT SURVEYS:  Modified Oswestry: 21 / 50 = 42.0 %  COGNITION: Overall cognitive status: Within functional limits for tasks assessed     SENSATION: Light touch: Impaired , LLE from butt  cheek to feet, couple times per day  PALPATION: Pt demonstrates increased tenderness and tightness of bilateral lumbar paraspinals and CPA and UPA mobilizations of lumbar spine, L1-L5  LUMBAR ROM:   AROM eval  Flexion 25, pain  Extension 5, worse pain  Right lateral flexion 15, tightness  Left lateral flexion 10, nothing  Right rotation 75% available, tightness  Left rotation WFL   (Blank rows = not tested)   LOWER EXTREMITY MMT:    MMT Right eval Left eval  Hip flexion 4- 3+  Hip extension 4 4  Hip abduction 4 4-  Hip adduction    Hip internal rotation    Hip external rotation    Knee flexion    Knee extension    Ankle dorsiflexion    Ankle plantarflexion    Ankle inversion    Ankle eversion     (Blank rows = not tested)  LUMBAR SPECIAL TESTS:  Straight leg raise test: Positive, RLE positive for back pain  FUNCTIONAL TESTS:  5 times sit to stand: TBA14.37 sec 2 minute walk test: TBA 468 ft   SLS: TBA 60 each but needs more effort left side GAIT: Distance walked: 80 feet to and from treatment area Assistive device utilized: None Level of assistance: Complete Independence Comments: pt demonstrates close to normal gait pattern, initiation of gait is slow and cautious  TREATMENT DATE:   04/05/24 Treadmill 5 minutes 2% speed 2.4-2.8 mph Standing:  pallof with NBOS BTB 2X10 each side  Rows BTB 2X10  Shoulder extensions BTB 2X10 Doorway stretch 3X30 Lifting mechanics floor to waist 8#, box carry and placement OH Quadruped bird dog 2X10 Prone Press ups 10X Manual prone STM to lumbar paraspinals, ES, QL, Lt>Rt  04/02/24: Treadmill 5 minutes 2% speed 2.5-2.8 mph Quadruped bird dog 10x 5   Cat/cow 10x 10   Child's pose neutral, Rt and Lt 30 each Standing:  3D hip excursion (squat, extension, rotation, sidebend) Manual: prone STM to lumbar erector spinae, paraspinals, QL  03/30/24 Standing:  pallof with NBOS GTB 2X10 each side  Rows GTB  2X10  Shoulder extensions GTB 2X10 Doorway stretch 3X30 Treadmill 5 minutes 2% speed 2.4-2.8 mph Quadruped bird dog 2X10  Cat/cow 2X10 Prone on elbows 1 minute  Press ups 5X Manual, STM/trigger point in prone to LT lumbar paraspinals Education of body mechanics  03/25/2024  Manual Therapy: -CPA/UPAs of Lumbar Spinal segments L3-L5, grade II-III mobilizations -STM of Lumbar Paraspinal musculature bilaterally Therapeutic Exercise: -Treadmill, 5 minutes, grade 2.0 incline, speed 1.8>2.8 -Supine bridges with GTB at knees, 2 sets of 10 reps, 3 second holds, pt cued for max hip extension  -LTR on green theraball, 2 set of 10 reps bilaterally, pt cued to remain in pain free ROM -DKTC, 2 set of 10 reps on green theraball, pt cued for pain free ROM Neuromuscular Re-education: -Pallof press, 2 sets of 10 reps, GTB at chest level, pt cued for core contraction throughout -Shoulder Extensions, 2 sets of 10 reps, GTB at chest level, pt cued for core contraction throughout -Bird dogs, 2 set of 8 reps, 4lb ankle weight added on RLE, pt cued for neutral spine -Side plank, 3 reps of 10 second holds bilaterally, better performance on left side (Hx of right shoulder surgery) -Cat/Cow stretch, 1 set of 15 reps, pt cued for pain free ROM   03/23/24 5 times sit to stand 14.37 sec no UE assist 2 MWT 468 ft no AD SLS 60 each side; more difficulty left side Standing calf stretch  5 x 20 Soleus stretch 5 x 20 Supine: LTR 10 x 10 Active hamstring stretch 5 x 20 McGill 3 Abdominal curl 3 hold x 10 each Side plank 10 x 5 Bird dog x 10 each side Grade 2  mobs lumbar spine x 10 Instruction in use of tennis ball for soft tissue compression; release muscle knots Seated hamstring stretch 5 x 20    03/11/2024   Evaluation: -ROM measured, Strength assessed, HEP prescribed, pt educated on prognosis, findings, and importance of HEP compliance if given.                                                                                                                                   PATIENT EDUCATION:  Education details: Pt was educated on findings of PT evaluation, prognosis, frequency of therapy visits and rationale, attendance policy, and HEP if given.   04/05/24:  lifting education and review Person educated: Patient Education method: Explanation, Verbal cues, and Handouts Education comprehension: verbalized understanding, verbal cues required, and needs further education  HOME EXERCISE PROGRAM: Access Code: [email or web address] URL: [email or web address] Date: 03/12/2024 Prepared by: Lang Ada  Exercises - Supine Lower Trunk Rotation  - 1 x daily - 7 x weekly - 3 sets - 10 reps - Bird Dog  - 1 x daily - 7 x weekly - 3 sets - 10 reps - Side Plank on Elbow  - 1 x daily - 7 x weekly - 3 sets - 10 reps - Neutral Curl Up with Straight Leg  - 1 x daily - 7 x weekly - 3 sets - 10 reps - 3 hold  ASSESSMENT:  CLINICAL IMPRESSION: Continued with core stabilization and lumbar mobility.  More education on body mechanics with pt successfully demonstrating correct technique with floor to waist lift. Pt admits to using back more than LE's in the past but with no knee issues to complete correctly.   Pt able to complete all ativities today without increased symptoms.  Manual continued with more spasms Lt paraspinals.  Able to reduce but not fully eased pain.   Eval:Patient is a 33 y.o. male who was seen today for physical therapy evaluation and treatment for back pain. Patient demonstrates increased lumbar spine stiffness/pain following lumbar discectomy earlier this year. Pt demonstrates signs of decreased mobility and muscle guarding of surgical region. Pt demonstrates decreased LE/core strength, abnormal pain with AROM of lumbar spine, and impaired functional mobility. Patient also demonstrates positive result on RLE during straight leg test, increased back pain noted. Patient also  demonstrates decreased speed with functional transfers and bed mobility during evaluation with cautious movement noted. Patient requires education on role of PT, HEP, HEP compliance and POC. Patient would benefit from skilled physical therapy for decreased low back pain, increased endurance with mobility, increased LE/core strength, and balance for improved gait quality, return to higher level of function with ADLs, and progress towards therapy goals.   OBJECTIVE IMPAIRMENTS: decreased activity tolerance, decreased endurance, decreased mobility, difficulty walking, decreased ROM, decreased strength, hypomobility, and pain.   ACTIVITY LIMITATIONS: carrying, lifting, bending, sitting, standing, squatting, transfers, and bed mobility  PARTICIPATION LIMITATIONS: meal prep, cleaning, laundry, driving, shopping, community activity,  occupation, and yard work  PERSONAL FACTORS: Age, Past/current experiences, and Time since onset of injury/illness/exacerbation are also affecting patient's functional outcome.   REHAB POTENTIAL: Good  CLINICAL DECISION MAKING: Stable/uncomplicated  EVALUATION COMPLEXITY: Low   GOALS: Goals reviewed with patient? No  SHORT TERM GOALS: Target date: 04/02/24  Pt will be independent with HEP in order to demonstrate participation in Physical Therapy POC.  Baseline: Goal status: INITIAL  2.  Pt will report 4/10 pain/stiffness with mobility in order to demonstrate improved pain with ADLs.  Baseline:  Goal status: INITIAL  LONG TERM GOALS: Target date: 04/23/24  Pt will increased lumbar spine ROM by a combined 20 degrees in order to demonstrate improved functional mobility to return to desired activities.  Baseline: see objective.  Goal status: INITIAL  2.  Pt will improve 2 MWT by at least 40 feet in order to demonstrate improved functional ambulatory capacity in community setting.  Baseline: see objective.  Goal status: INITIAL  3.  Pt will improve Modified  Oswestry score by at least 6 points in order to demonstrate improved pain with functional goals and outcomes. Baseline: see objective.  Goal status: INITIAL  4.  Pt will report 2/10 pain with mobility in order to demonstrate reduced pain with ADLs lasting greater than 30 minutes.  Baseline: see objective.  Goal status: INITIAL   PLAN:  PT FREQUENCY: 1-2x/week  PT DURATION: 6 weeks  PLANNED INTERVENTIONS: 97110-Therapeutic exercises, 97530- Therapeutic activity, [id number]- Neuromuscular re-education, 97535- Self Care, 02859- Manual therapy, [phone]- Gait training, [phone]- Electrical stimulation (unattended), [phone]- Electrical stimulation (manual), Patient/Family education, Balance training, Stair training, Spinal mobilization, Cryotherapy, and Moist heat.  PLAN FOR NEXT SESSION:  continue to progress core and proximal LE strengthening, manual as needed. Review correct lifting, body mechanics as needed.  Add lunges next session.   Greig KATHEE Fuse, PTA/CLT [facility] Ph: [phone]  7:42 AM, 04/05/24  Lang Ada, PT, DPT [facility] Office: [phone] 3:29 PM, 04/27/24

## 2024-04-07 ENCOUNTER — Encounter (HOSPITAL_COMMUNITY)

## 2024-04-12 ENCOUNTER — Encounter (HOSPITAL_COMMUNITY): Admitting: Physical Therapy

## 2024-04-15 ENCOUNTER — Telehealth (HOSPITAL_COMMUNITY): Payer: Self-pay

## 2024-04-15 ENCOUNTER — Encounter (HOSPITAL_COMMUNITY)

## 2024-04-15 NOTE — Telephone Encounter (Signed)
 No show #1, called and left message concerning missed apt. Reminded next apt date and time with contact number included if needs to cancel/reschedule in the future.   Augustin Mclean, LPTA/CLT; [facility] [phone]

## 2024-04-20 ENCOUNTER — Encounter (HOSPITAL_COMMUNITY): Admitting: Physical Therapy

## 2024-04-27 ENCOUNTER — Telehealth (HOSPITAL_COMMUNITY): Payer: Self-pay | Admitting: Physical Therapy

## 2024-04-27 ENCOUNTER — Encounter (HOSPITAL_COMMUNITY): Admitting: Physical Therapy

## 2024-04-27 NOTE — Telephone Encounter (Signed)
 Pt did not show for appt.  Pt cancelled previous appt stating therapy was not helping and was returning to MD.  No further appts remain and is out of cert date.  Discharge from therapy at this time.  Christopher Frey, PTA/CLT [facility] Ph: [phone]

## 2024-05-08 ENCOUNTER — Ambulatory Visit
Admission: EM | Admit: 2024-05-08 | Discharge: 2024-05-08 | Disposition: A | Discharge status: Home / Self Care | Attending: Family Medicine | Admitting: Family Medicine

## 2024-05-08 DIAGNOSIS — J0100 Acute maxillary sinusitis, unspecified: Secondary | ICD-10-CM | POA: Diagnosis not present

## 2024-05-08 MED ORDER — DOXYCYCLINE HYCLATE 100 MG PO CAPS: 100.0000 mg | ORAL_CAPSULE | Freq: Two times a day (BID) | ORAL | 0 refills | Status: AC

## 2024-05-08 MED ORDER — PREDNISONE 20 MG PO TABS: 40.0000 mg | ORAL_TABLET | Freq: Every day | ORAL | 0 refills | Status: AC

## 2024-05-08 NOTE — Discharge Instructions (Signed)
 Treating you for a sinus infection.  Take the antibiotics and the prednisone  as prescribed.  Continue Mucinex as needed.  Follow-up as needed.  Make sure you are drinking plenty of fluids

## 2024-05-08 NOTE — ED Triage Notes (Signed)
 Pt reports he has sinus pressure, teeth pain, fevers, body aches, cold chills, x 8 days   Took sudafed

## 2024-05-08 NOTE — ED Provider Notes (Signed)
 [facility]    CSN: [id number] Arrival date & time: 05/08/24  1115      History   Chief Complaint No chief complaint on file.   HPI Christopher Frey is a 33 y.o. male.   Patient is a 33 year old male who presents today with increased sinus pressure, congestion, headache, dental pain, body aches, cold chills.  This has been ongoing for approximately 8 days.  History of seasonal allergies.  Has been taking Sudafed for his symptoms and his allergy  medication     Past Medical History:  Diagnosis Date   Acid reflux     Patient Active Problem List   Diagnosis Date Noted   Mild intermittent asthma without complication 01/16/2024   Seasonal and perennial allergic rhinitis 01/16/2024   Gastroesophageal reflux disease 01/16/2024   Difficulty walking 06/05/2012   Stiffness of joints, not elsewhere classified, multiple sites 06/05/2012    Past Surgical History:  Procedure Laterality Date   BIOPSY  05/08/2021   Procedure: BIOPSY;  Surgeon: Cindie Carlin POUR, DO;  [facility];  Service: Endoscopy;;   ESOPHAGOGASTRODUODENOSCOPY (EGD) WITH PROPOFOL  N/A 05/08/2021   Procedure: ESOPHAGOGASTRODUODENOSCOPY (EGD) WITH PROPOFOL ;  Surgeon: Cindie Carlin POUR, DO;  [facility];  Service: Endoscopy;  Laterality: N/A;  3:00pm   SHOULDER SURGERY         Home Medications    Prior to Admission medications   Medication Sig Start Date End Date Taking? Authorizing Provider  doxycycline  (VIBRAMYCIN ) 100 MG capsule Take 1 capsule (100 mg total) by mouth 2 (two) times daily. 05/08/24  Yes Heath Badon A, FNP  predniSONE  (DELTASONE ) 20 MG tablet Take 2 tablets (40 mg total) by mouth daily with breakfast for 5 days. 05/08/24 05/13/24 Yes Kelsei Defino A, FNP  albuterol  (PROVENTIL ) (2.5 MG/3ML) 0.083% nebulizer solution Take 3 mLs (2.5 mg total) by nebulization every 6 (six) hours as needed for wheezing or shortness of breath. 04/02/22   Chandra Harlene LABOR, NP  albuterol   (VENTOLIN  HFA) 108 (90 Base) MCG/ACT inhaler Inhale 2 puffs into the lungs every 4 (four) hours as needed for wheezing or shortness of breath. 04/10/22   Stuart Vernell Norris, PA-C  amLODipine (NORVASC) 2.5 MG tablet Take 2.5 mg by mouth daily. 09/25/23   [provider]  EPINEPHrine  (EPIPEN  2-PAK) 0.3 mg/0.3 mL IJ SOAJ injection Inject 0.3 mg into the muscle as needed for anaphylaxis. 11/03/23   Iva Marty Saltness, MD  fexofenadine (ALLEGRA) 180 MG tablet Take 180 mg by mouth daily.    [provider]  ipratropium (ATROVENT ) 0.06 % nasal spray Place 2 sprays into both nostrils 3 (three) times daily. 07/30/23   Iva Marty Saltness, MD  montelukast  (SINGULAIR ) 10 MG tablet Take 1 tablet (10 mg total) by mouth at bedtime. 01/16/24   Cari Arlean HERO, FNP  Olopatadine-Mometasone (RYALTRIS ) 665-25 MCG/ACT SUSP Place 2 sprays into the nose 2 (two) times daily as needed. 01/16/24   Ambs, Arlean HERO, FNP    Family History Family History  Problem Relation Age of Onset   Allergic rhinitis Father    Allergic rhinitis Brother    Angioedema Neg Hx    Asthma Neg Hx    Eczema Neg Hx    Immunodeficiency Neg Hx    Urticaria Neg Hx     Social History Social History   Tobacco Use   Smoking status: Never    Passive exposure: Past   Smokeless tobacco: Current    Types: Chew  Vaping Use  Vaping status: Never Used  Substance Use Topics   Alcohol use: Yes    Alcohol/week: 2.0 - 3.0 standard drinks of alcohol    Types: 2 - 3 Cans of beer per week   Drug use: No     Allergies   Patient has no known allergies.   Review of Systems Review of Systems  See HPI Physical Exam Triage Vital Signs ED Triage Vitals  Encounter Vitals Group     BP 05/08/24 1124 127/83     Girls Systolic BP Percentile --      Girls Diastolic BP Percentile --      Boys Systolic BP Percentile --      Boys Diastolic BP Percentile --      Pulse Rate 05/08/24 1124 (!) 109     Resp 05/08/24 1124 20     Temp  05/08/24 1124 99.7 F (37.6 C)     Temp Source 05/08/24 1124 Oral     SpO2 05/08/24 1124 95 %     Weight --      Height --      Head Circumference --      Peak Flow --      Pain Score 05/08/24 1123 5     Pain Loc --      Pain Education --      Exclude from Growth Chart --    No data found.  Updated Vital Signs BP 127/83 (BP Location: Right Arm)   Pulse (!) 109   Temp 99.7 F (37.6 C) (Oral)   Resp 20   SpO2 95%   Visual Acuity Right Eye Distance:   Left Eye Distance:   Bilateral Distance:    Right Eye Near:   Left Eye Near:    Bilateral Near:     Physical Exam Constitutional:      General: He is not in acute distress.    Appearance: Normal appearance. He is not ill-appearing, toxic-appearing or diaphoretic.  HENT:     Right Ear: Tympanic membrane, ear canal and external ear normal.     Left Ear: Tympanic membrane, ear canal and external ear normal.     Nose: Congestion present.     Mouth/Throat:     Pharynx: Oropharynx is clear.  Eyes:     Conjunctiva/sclera: Conjunctivae normal.  Cardiovascular:     Rate and Rhythm: Normal rate and regular rhythm.     Pulses: Normal pulses.     Heart sounds: Normal heart sounds.  Pulmonary:     Effort: Pulmonary effort is normal.     Breath sounds: Normal breath sounds.  Musculoskeletal:        General: Normal range of motion.  Skin:    General: Skin is warm and dry.  Neurological:     Mental Status: He is alert.  Psychiatric:        Mood and Affect: Mood normal.      UC Treatments / Results  Labs (all labs ordered are listed, but only abnormal results are displayed) Labs Reviewed - No data to display  EKG   Radiology No results found.  Procedures Procedures (including critical care time)  Medications Ordered in UC Medications - No data to display  Initial Impression / Assessment and Plan / UC Course  I have reviewed the triage vital signs and the nursing notes.  Pertinent labs & imaging results that  were available during my care of the patient were reviewed by me and considered in my medical decision making (  see chart for details).     Acute sinusitis-treating for a sinus infection today with doxycycline .  Antibiotics as prescribed.  Prednisone  as prescribed.  Continue Mucinex as needed and follow-up as needed. Final Clinical Impressions(s) / UC Diagnoses   Final diagnoses:  Acute non-recurrent maxillary sinusitis     Discharge Instructions      Treating you for a sinus infection.  Take the antibiotics and the prednisone  as prescribed.  Continue Mucinex as needed.  Follow-up as needed.  Make sure you are drinking plenty of fluids    ED Prescriptions     Medication Sig Dispense Auth. Provider   doxycycline  (VIBRAMYCIN ) 100 MG capsule Take 1 capsule (100 mg total) by mouth 2 (two) times daily. 20 capsule Damar Petit A, FNP   predniSONE  (DELTASONE ) 20 MG tablet Take 2 tablets (40 mg total) by mouth daily with breakfast for 5 days. 10 tablet Adah Wilbert LABOR, FNP      PDMP not reviewed this encounter.   Adah Wilbert LABOR, FNP 05/08/24 [phone]

## 2024-05-31 ENCOUNTER — Encounter: Payer: Self-pay | Admitting: *Deleted

## 2024-08-18 ENCOUNTER — Ambulatory Visit: Admitting: Allergy & Immunology
